# Patient Record
Sex: Female | Born: 1965
Health system: Southern US, Community
[De-identification: ages and names within clinical notes are randomized; demographics above are authoritative.]

## PROBLEM LIST (undated history)

## (undated) ENCOUNTER — Ambulatory Visit: Source: Home / Self Care

## (undated) DIAGNOSIS — E7800 Pure hypercholesterolemia, unspecified: Secondary | ICD-10-CM

## (undated) DIAGNOSIS — B009 Herpesviral infection, unspecified: Secondary | ICD-10-CM

## (undated) DIAGNOSIS — M109 Gout, unspecified: Secondary | ICD-10-CM

## (undated) DIAGNOSIS — I10 Essential (primary) hypertension: Secondary | ICD-10-CM

## (undated) HISTORY — DX: Pure hypercholesterolemia, unspecified: E78.00

## (undated) HISTORY — DX: Herpesviral infection, unspecified: B00.9

## (undated) HISTORY — DX: Gout, unspecified: M10.9

## (undated) HISTORY — DX: Essential (primary) hypertension: I10

## (undated) HISTORY — PX: CERVICAL BIOPSY  W/ LOOP ELECTRODE EXCISION: SUR135

## (undated) HISTORY — PX: INTRAUTERINE DEVICE INSERTION: SHX323

---

## 1998-03-13 ENCOUNTER — Other Ambulatory Visit: Admission: RE | Admit: 1998-03-13 | Discharge: 1998-03-13 | Payer: Self-pay | Admitting: Obstetrics

## 2000-02-12 ENCOUNTER — Other Ambulatory Visit: Admission: RE | Admit: 2000-02-12 | Discharge: 2000-02-12 | Payer: Self-pay | Admitting: Obstetrics

## 2000-05-11 ENCOUNTER — Other Ambulatory Visit: Admission: RE | Admit: 2000-05-11 | Discharge: 2000-05-11 | Payer: Self-pay | Admitting: Obstetrics

## 2000-06-25 ENCOUNTER — Other Ambulatory Visit: Admission: RE | Admit: 2000-06-25 | Discharge: 2000-06-25 | Payer: Self-pay | Admitting: Obstetrics

## 2003-07-16 ENCOUNTER — Ambulatory Visit (HOSPITAL_COMMUNITY): Admission: RE | Admit: 2003-07-16 | Discharge: 2003-07-16 | Payer: Self-pay | Admitting: Obstetrics

## 2003-07-16 ENCOUNTER — Encounter: Payer: Self-pay | Admitting: Obstetrics

## 2005-09-09 ENCOUNTER — Ambulatory Visit (HOSPITAL_COMMUNITY): Admission: RE | Admit: 2005-09-09 | Discharge: 2005-09-09 | Payer: Self-pay | Admitting: Obstetrics

## 2005-09-09 ENCOUNTER — Encounter (INDEPENDENT_AMBULATORY_CARE_PROVIDER_SITE_OTHER): Payer: Self-pay | Admitting: *Deleted

## 2005-10-14 ENCOUNTER — Ambulatory Visit (HOSPITAL_COMMUNITY): Admission: RE | Admit: 2005-10-14 | Discharge: 2005-10-14 | Payer: Self-pay | Admitting: Obstetrics

## 2005-10-14 ENCOUNTER — Encounter (INDEPENDENT_AMBULATORY_CARE_PROVIDER_SITE_OTHER): Payer: Self-pay | Admitting: Specialist

## 2008-01-25 ENCOUNTER — Ambulatory Visit (HOSPITAL_COMMUNITY): Admission: RE | Admit: 2008-01-25 | Discharge: 2008-01-25 | Payer: Self-pay | Admitting: Obstetrics

## 2008-01-25 ENCOUNTER — Encounter (INDEPENDENT_AMBULATORY_CARE_PROVIDER_SITE_OTHER): Payer: Self-pay | Admitting: Obstetrics

## 2008-04-09 ENCOUNTER — Emergency Department (HOSPITAL_COMMUNITY): Admission: EM | Admit: 2008-04-09 | Discharge: 2008-04-09 | Payer: Self-pay | Admitting: Emergency Medicine

## 2008-07-11 ENCOUNTER — Encounter (INDEPENDENT_AMBULATORY_CARE_PROVIDER_SITE_OTHER): Payer: Self-pay | Admitting: Obstetrics

## 2008-07-11 ENCOUNTER — Ambulatory Visit (HOSPITAL_COMMUNITY): Admission: RE | Admit: 2008-07-11 | Discharge: 2008-07-11 | Payer: Self-pay | Admitting: Obstetrics

## 2010-02-27 ENCOUNTER — Ambulatory Visit (HOSPITAL_COMMUNITY): Admission: RE | Admit: 2010-02-27 | Discharge: 2010-02-27 | Payer: Self-pay | Admitting: Obstetrics & Gynecology

## 2010-04-09 ENCOUNTER — Encounter: Admission: RE | Admit: 2010-04-09 | Discharge: 2010-04-09 | Payer: Self-pay | Admitting: Family Medicine

## 2011-03-24 ENCOUNTER — Other Ambulatory Visit (HOSPITAL_COMMUNITY): Payer: Self-pay | Admitting: Obstetrics & Gynecology

## 2011-03-24 DIAGNOSIS — Z1231 Encounter for screening mammogram for malignant neoplasm of breast: Secondary | ICD-10-CM

## 2011-03-25 ENCOUNTER — Ambulatory Visit (HOSPITAL_COMMUNITY)
Admission: RE | Admit: 2011-03-25 | Discharge: 2011-03-25 | Disposition: A | Discharge status: Home / Self Care | Payer: BC Managed Care – PPO | Source: Ambulatory Visit | Attending: Obstetrics & Gynecology | Admitting: Obstetrics & Gynecology

## 2011-03-25 DIAGNOSIS — Z1231 Encounter for screening mammogram for malignant neoplasm of breast: Secondary | ICD-10-CM | POA: Insufficient documentation

## 2011-03-26 ENCOUNTER — Other Ambulatory Visit: Payer: Self-pay | Admitting: Obstetrics & Gynecology

## 2011-03-26 DIAGNOSIS — R928 Other abnormal and inconclusive findings on diagnostic imaging of breast: Secondary | ICD-10-CM

## 2011-03-31 ENCOUNTER — Ambulatory Visit
Admission: RE | Admit: 2011-03-31 | Discharge: 2011-03-31 | Disposition: A | Discharge status: Home / Self Care | Payer: BC Managed Care – PPO | Source: Ambulatory Visit | Attending: Obstetrics & Gynecology | Admitting: Obstetrics & Gynecology

## 2011-03-31 DIAGNOSIS — R928 Other abnormal and inconclusive findings on diagnostic imaging of breast: Secondary | ICD-10-CM

## 2011-04-07 NOTE — Op Note (Signed)
NAME:  Lynn Crawford, Lynn Crawford NO.:  [id number]   MEDICAL RECORD NO.:  [id number]          PATIENT TYPE:  AMB   LOCATION:  SDC                           FACILITY:  [facility]   PHYSICIAN:  Kathreen Cosier, M.D.DATE OF BIRTH:  1966/06/24   DATE OF PROCEDURE:  07/11/2008  DATE OF DISCHARGE:                               OPERATIVE REPORT   PREOP DIAGNOSIS:  Cervical stenosis secondary to cervical colonization.   Under using MAC, the patient in lithotomy position, perineum and vagina  prepped and draped, bladder emptied with straight catheter.  Bimanual  exam revealed uterus to be enlarged and soft.  Speculum placed in the  vagina.  Cervix injected with 10 mL of 1% Xylocaine and the anterior lip  of the cervix grasped with tenaculum.  Endocervical curette was inserted  and the endocervix curetted.  At the same time, large amount of purulent  material was drained from the uterus.  The cervix was then dilated up to  #31 Shawnie Pons and greater than 100 mL of grey purulent material and old  blood drained from the uterine cavity.  There was also some bright red  bleeding at the same time.  Endocervix was curetted.  The endometrium  was not curetted for fear of spreading infection outside the uterine  cavity.  The patient tolerated the procedure well, taken to recovery  room in good condition.           ______________________________  Kathreen Cosier, M.D.     BAM/MEDQ  D:  07/11/2008  T:  07/12/2008  Job:  [id number]

## 2011-04-07 NOTE — Op Note (Signed)
[name], [name] NO.:  [id number]   MEDICAL RECORD NO.:  [id number]          PATIENT TYPE:  AMB   LOCATION:  SDC                           FACILITY:  [facility]   PHYSICIAN:  Kathreen Cosier, M.D.DATE OF BIRTH:  [birth year]   DATE OF PROCEDURE:  01/25/2008  DATE OF DISCHARGE:                               OPERATIVE REPORT   PREOPERATIVE DIAGNOSIS:  Severe dysplasia of the cervix.   PROCEDURE:  Cold knife conization of the cervix.   Using MAC, the patient in lithotomy position, perineum and vagina  prepped and draped, bladder emptied with straight catheter.  Weighted  speculum placed in vagina.  Cervix grasped with the Allis clamp at 3  o'clock and #1 suture placed laterally on the outer aspect at 3 o'clock  and 9 o'clock for hemostasis.  Then a cold knife cone was done in the  usual manner.  Hemostasis was achieved with U sutures of #1 placed  around the cervix and the cervical canal was sounded and the endometrial  cavity sounded 9 cm.  The patient tolerated the procedure well, taken to  the recovery room in good condition.           ______________________________  Kathreen Cosier, M.D.     BAM/MEDQ  D:  01/25/2008  T:  01/25/2008  Job:  [id number]

## 2011-04-10 NOTE — Op Note (Signed)
NAME:  Lynn Crawford, Lynn Crawford NO.:  [id number]   MEDICAL RECORD NO.:  [id number]          PATIENT TYPE:  AMB   LOCATION:  SDC                           FACILITY:  [facility]   PHYSICIAN:  Kathreen Cosier, M.D.DATE OF BIRTH:  08-23-1966   DATE OF PROCEDURE:  10/14/2005  DATE OF DISCHARGE:                                 OPERATIVE REPORT   PREOPERATIVE DIAGNOSIS:  Dysplasia, dysplastic cells, possibly from the  cervix.   PROCEDURE:  Cold-knife conization.   Monitored anesthesia, the patient in lithotomy position, the perineum and  vagina prepped and draped, bladder emptied with a straight catheter.  Bimanual exam revealed uterus to be normal size.  There was no prolapse.  Negative adnexa.  Weighted speculum placed in vagina.  Hemostatic suture of  #1 was placed at 3 o'clock and 9 o'clock on the lateral aspects of the  cervix.  Then a cold knife cone done in the usual manner, and hemostasis was  achieved with few sutures of #1 chromic on the cervix.  The cervical canal  was then sounded and noted to be open.  The patient tolerated the procedure  well and taken to the recovery room in good condition.           ______________________________  Kathreen Cosier, M.D.     BAM/MEDQ  D:  10/14/2005  T:  10/14/2005  Job:  [id number]

## 2011-04-10 NOTE — Op Note (Signed)
NAME:  Lynn Crawford, HAYDON NO.:  [id number]   MEDICAL RECORD NO.:  [id number]          PATIENT TYPE:  AMB   LOCATION:  SDC                           [facility]ILITY:  [facility]   PHYSICIAN:  Kathreen Cosier, M.D.DATE OF BIRTH:  06-22-1966   DATE OF PROCEDURE:  09/09/2005  DATE OF DISCHARGE:                                 OPERATIVE REPORT   PREOPERATIVE DIAGNOSIS:  Dysfunctional uterine bleeding.   PROCEDURE:  Hysteroscopy and sharp curettage.   Under general anesthesia, the patient in lithotomy position, perineum and  vagina prepped and draped, bladder emptied with a straight catheter.  Bimanual exam revealed the uterus to be normal size, negative adnexa.  Speculum placed in the vagina, cervix injected with 10 mL of 1% Xylocaine at  3, 9 and 12 o'clock.  Anterior lip of the cervix grasped with a tenaculum  and the cervix curetted and a small amount of tissue obtained.  The  endometrial cavity was sounded to 10 cm.  The cavity was posterior.  The  cervix was dilated to a #27 Shawnie Pons and the diagnostic hysteroscope inserted.  The cavity was normal, no myomas, no polyps.  Fluid deficit 30 mL.  Sharp  curettage was then performed and a large amount of tissue obtained.  The  patient tolerates the procedure well, is taken to the recovery room in good  condition.           ______________________________  Kathreen Cosier, M.D.     BAM/MEDQ  D:  09/09/2005  T:  09/09/2005  Job:  [id number]

## 2011-08-17 LAB — CBC
HCT: 37.2
MCV: 90.6
Platelets: 349
RDW: 13.3

## 2011-08-19 LAB — PREGNANCY, URINE: Preg Test, Ur: NEGATIVE

## 2011-08-19 LAB — DIFFERENTIAL
Basophils Absolute: 0
Basophils Relative: 0
Eosinophils Absolute: 0.1
Eosinophils Relative: 1
Lymphocytes Relative: 22
Lymphs Abs: 2.9
Monocytes Absolute: 0.7
Monocytes Relative: 5
Neutro Abs: 9.6 — ABNORMAL HIGH
Neutrophils Relative %: 72

## 2011-08-19 LAB — URINALYSIS, ROUTINE W REFLEX MICROSCOPIC
Bilirubin Urine: NEGATIVE
Glucose, UA: NEGATIVE
Ketones, ur: NEGATIVE
Nitrite: NEGATIVE
Protein, ur: NEGATIVE
Specific Gravity, Urine: 1.024
Urobilinogen, UA: 0.2
pH: 5.5

## 2011-08-19 LAB — BASIC METABOLIC PANEL
CO2: 23
Chloride: 108
GFR calc Af Amer: >60
Potassium: 3.7
Sodium: 138

## 2011-08-19 LAB — CBC
HCT: 39
Hemoglobin: 13.3
MCHC: 34
MCV: 89.4
Platelets: 313
RBC: 4.37
RDW: 13
WBC: 13.4 — ABNORMAL HIGH

## 2011-08-19 LAB — BASIC METABOLIC PANEL WITH GFR
BUN: 14
Calcium: 9.3
Creatinine, Ser: 0.85
GFR calc non Af Amer: >60
Glucose, Bld: 101 — ABNORMAL HIGH

## 2011-08-19 LAB — WET PREP, GENITAL
Trich, Wet Prep: NONE SEEN
Yeast Wet Prep HPF POC: NONE SEEN

## 2011-08-19 LAB — GC/CHLAMYDIA PROBE AMP, GENITAL
Chlamydia, DNA Probe: NEGATIVE
GC Probe Amp, Genital: NEGATIVE

## 2011-08-19 LAB — URINE MICROSCOPIC-ADD ON

## 2012-03-14 ENCOUNTER — Other Ambulatory Visit (HOSPITAL_COMMUNITY): Payer: Self-pay | Admitting: Obstetrics & Gynecology

## 2012-03-14 DIAGNOSIS — Z1231 Encounter for screening mammogram for malignant neoplasm of breast: Secondary | ICD-10-CM

## 2012-04-06 ENCOUNTER — Ambulatory Visit (HOSPITAL_COMMUNITY)
Admission: RE | Admit: 2012-04-06 | Discharge: 2012-04-06 | Disposition: A | Discharge status: Home / Self Care | Payer: BC Managed Care – PPO | Source: Ambulatory Visit | Attending: Obstetrics & Gynecology | Admitting: Obstetrics & Gynecology

## 2012-04-06 DIAGNOSIS — Z1231 Encounter for screening mammogram for malignant neoplasm of breast: Secondary | ICD-10-CM | POA: Insufficient documentation

## 2013-05-02 ENCOUNTER — Other Ambulatory Visit (HOSPITAL_COMMUNITY): Payer: Self-pay | Admitting: Obstetrics & Gynecology

## 2013-05-02 DIAGNOSIS — Z1231 Encounter for screening mammogram for malignant neoplasm of breast: Secondary | ICD-10-CM

## 2013-05-04 ENCOUNTER — Ambulatory Visit (HOSPITAL_COMMUNITY)
Admission: RE | Admit: 2013-05-04 | Discharge: 2013-05-04 | Disposition: A | Discharge status: Home / Self Care | Payer: BC Managed Care – PPO | Source: Ambulatory Visit | Attending: Obstetrics & Gynecology | Admitting: Obstetrics & Gynecology

## 2013-05-04 DIAGNOSIS — Z1231 Encounter for screening mammogram for malignant neoplasm of breast: Secondary | ICD-10-CM

## 2013-05-08 ENCOUNTER — Other Ambulatory Visit: Payer: Self-pay | Admitting: Obstetrics & Gynecology

## 2013-05-08 DIAGNOSIS — R928 Other abnormal and inconclusive findings on diagnostic imaging of breast: Secondary | ICD-10-CM

## 2013-05-24 ENCOUNTER — Other Ambulatory Visit: Payer: BC Managed Care – PPO

## 2013-06-13 ENCOUNTER — Ambulatory Visit
Admission: RE | Admit: 2013-06-13 | Discharge: 2013-06-13 | Disposition: A | Discharge status: Home / Self Care | Payer: BC Managed Care – PPO | Source: Ambulatory Visit | Attending: Obstetrics & Gynecology | Admitting: Obstetrics & Gynecology

## 2013-06-13 DIAGNOSIS — R928 Other abnormal and inconclusive findings on diagnostic imaging of breast: Secondary | ICD-10-CM

## 2014-07-25 ENCOUNTER — Other Ambulatory Visit (HOSPITAL_COMMUNITY): Payer: Self-pay | Admitting: Obstetrics & Gynecology

## 2014-07-25 DIAGNOSIS — Z1231 Encounter for screening mammogram for malignant neoplasm of breast: Secondary | ICD-10-CM

## 2014-07-27 ENCOUNTER — Ambulatory Visit (HOSPITAL_COMMUNITY): Payer: BC Managed Care – PPO | Attending: Obstetrics & Gynecology

## 2014-08-13 ENCOUNTER — Ambulatory Visit (HOSPITAL_COMMUNITY)
Admission: RE | Admit: 2014-08-13 | Discharge: 2014-08-13 | Disposition: A | Discharge status: Home / Self Care | Payer: BC Managed Care – PPO | Source: Ambulatory Visit | Attending: Obstetrics & Gynecology | Admitting: Obstetrics & Gynecology

## 2014-08-13 DIAGNOSIS — Z1231 Encounter for screening mammogram for malignant neoplasm of breast: Secondary | ICD-10-CM

## 2016-03-27 DIAGNOSIS — K219 Gastro-esophageal reflux disease without esophagitis: Secondary | ICD-10-CM | POA: Diagnosis not present

## 2016-03-27 DIAGNOSIS — E782 Mixed hyperlipidemia: Secondary | ICD-10-CM | POA: Diagnosis not present

## 2016-03-27 DIAGNOSIS — I10 Essential (primary) hypertension: Secondary | ICD-10-CM | POA: Diagnosis not present

## 2016-03-27 DIAGNOSIS — G245 Blepharospasm: Secondary | ICD-10-CM | POA: Diagnosis not present

## 2016-09-17 DIAGNOSIS — H16291 Other keratoconjunctivitis, right eye: Secondary | ICD-10-CM | POA: Diagnosis not present

## 2016-09-21 DIAGNOSIS — H16291 Other keratoconjunctivitis, right eye: Secondary | ICD-10-CM | POA: Diagnosis not present

## 2016-11-03 DIAGNOSIS — Z1231 Encounter for screening mammogram for malignant neoplasm of breast: Secondary | ICD-10-CM | POA: Diagnosis not present

## 2016-11-03 DIAGNOSIS — Z6833 Body mass index (BMI) 33.0-33.9, adult: Secondary | ICD-10-CM | POA: Diagnosis not present

## 2016-11-03 DIAGNOSIS — Z01419 Encounter for gynecological examination (general) (routine) without abnormal findings: Secondary | ICD-10-CM | POA: Diagnosis not present

## 2016-12-15 DIAGNOSIS — H5213 Myopia, bilateral: Secondary | ICD-10-CM | POA: Diagnosis not present

## 2016-12-15 DIAGNOSIS — H524 Presbyopia: Secondary | ICD-10-CM | POA: Diagnosis not present

## 2017-06-23 DIAGNOSIS — E669 Obesity, unspecified: Secondary | ICD-10-CM | POA: Diagnosis not present

## 2017-06-23 DIAGNOSIS — A6004 Herpesviral vulvovaginitis: Secondary | ICD-10-CM | POA: Diagnosis not present

## 2017-06-23 DIAGNOSIS — M545 Low back pain: Secondary | ICD-10-CM | POA: Diagnosis not present

## 2017-06-24 ENCOUNTER — Other Ambulatory Visit: Payer: Self-pay | Admitting: Family Medicine

## 2017-06-24 ENCOUNTER — Ambulatory Visit
Admission: RE | Admit: 2017-06-24 | Discharge: 2017-06-24 | Disposition: A | Discharge status: Home / Self Care | Payer: BLUE CROSS/BLUE SHIELD | Source: Ambulatory Visit | Attending: Family Medicine | Admitting: Family Medicine

## 2017-06-24 DIAGNOSIS — M545 Low back pain: Secondary | ICD-10-CM | POA: Diagnosis not present

## 2017-06-24 DIAGNOSIS — S335XXA Sprain of ligaments of lumbar spine, initial encounter: Secondary | ICD-10-CM

## 2017-06-24 DIAGNOSIS — M5126 Other intervertebral disc displacement, lumbar region: Secondary | ICD-10-CM | POA: Diagnosis not present

## 2017-12-24 DIAGNOSIS — H524 Presbyopia: Secondary | ICD-10-CM | POA: Diagnosis not present

## 2017-12-24 DIAGNOSIS — H5213 Myopia, bilateral: Secondary | ICD-10-CM | POA: Diagnosis not present

## 2017-12-29 DIAGNOSIS — Z23 Encounter for immunization: Secondary | ICD-10-CM | POA: Diagnosis not present

## 2018-01-18 ENCOUNTER — Encounter: Payer: Self-pay | Admitting: Obstetrics & Gynecology

## 2018-04-05 ENCOUNTER — Ambulatory Visit (INDEPENDENT_AMBULATORY_CARE_PROVIDER_SITE_OTHER): Payer: BLUE CROSS/BLUE SHIELD | Admitting: Obstetrics & Gynecology

## 2018-04-05 ENCOUNTER — Encounter: Payer: Self-pay | Admitting: Obstetrics & Gynecology

## 2018-04-05 VITALS — BP 124/80 | Ht 63.75 in | Wt 190.0 lb

## 2018-04-05 DIAGNOSIS — Z1151 Encounter for screening for human papillomavirus (HPV): Secondary | ICD-10-CM | POA: Diagnosis not present

## 2018-04-05 DIAGNOSIS — Z01419 Encounter for gynecological examination (general) (routine) without abnormal findings: Secondary | ICD-10-CM

## 2018-04-05 DIAGNOSIS — Z8619 Personal history of other infectious and parasitic diseases: Secondary | ICD-10-CM

## 2018-04-05 DIAGNOSIS — Z113 Encounter for screening for infections with a predominantly sexual mode of transmission: Secondary | ICD-10-CM | POA: Diagnosis not present

## 2018-04-05 DIAGNOSIS — Z30431 Encounter for routine checking of intrauterine contraceptive device: Secondary | ICD-10-CM | POA: Diagnosis not present

## 2018-04-05 MED ORDER — VALACYCLOVIR HCL 1 G PO TABS: 500.0000 mg | ORAL_TABLET | Freq: Every day | ORAL | 4 refills | Status: DC

## 2018-04-05 NOTE — Patient Instructions (Signed)
1. Encounter for routine gynecological examination with Papanicolaou smear of cervix Normal gynecologic exam.  Pap with high-risk HPV, gonorrhea and chlamydia done.  Breast exam normal.  Will schedule screening mammogram now.  Health labs at work.  Counseling on screening colonoscopy done, recommended to organize now, patient will call when ready.  2. Encounter for routine checking of intrauterine contraceptive device (IUD) Mirena IUD well-tolerated and in good location.  Will be due to replace the IUD in March 2020.  3. H/O herpes genitalis No recent recurrence on Valtrex generic prophylaxis.  Prescription sent to pharmacy.  Other orders - valACYclovir (VALTREX) 1000 MG tablet; Take 0.5 tablets (500 mg total) by mouth daily.  Lynn Crawford, it was a pleasure seeing you today!  I will inform you of your results as soon as they are available.

## 2018-04-05 NOTE — Progress Notes (Signed)
Lynn Crawford 21-Jun-1966 [id number]   History:    52 y.o. G3P1A2L1  Divorced.  No current boyfriend.  Has a 98 yo grand-child.  RP:  Established patient presenting for annual gyn exam   HPI: Well on Mirena IUD x 01/2014.  Light menses.  No pelvic pain.  Normal vaginal secretions.  Not sexually active currently. H/O LEEP x 2.  Genital HSV, no recurrence on Valtrex prophylaxis. Urine/BMs wnl.  Breasts wnl.  BMI 32.87.  Health labs at work.  Past medical history,surgical history, family history and social history were all reviewed and documented in the EPIC chart.  Gynecologic History No LMP recorded. (Menstrual status: IUD). Contraception: IUD Last Pap: 09/2015. Results were: Negative/HR HPV neg Last mammogram: 10/2016. Results were: Negative Bone Density: Never Colonoscopy: Never.  Recommended to organize, patient will call back when ready.  Obstetric History OB History  Gravida Para Term Preterm AB Living  SAB TAB Ectopic Multiple Live Births               # Outcome Date GA Lbr Len/2nd Weight Sex Delivery Anes PTL Lv  3 AB           2 AB           1 Para              ROS: A ROS was performed and pertinent positives and negatives are included in the history.  GENERAL: No fevers or chills. HEENT: No change in vision, no earache, sore throat or sinus congestion. NECK: No pain or stiffness. CARDIOVASCULAR: No chest pain or pressure. No palpitations. PULMONARY: No shortness of breath, cough or wheeze. GASTROINTESTINAL: No abdominal pain, nausea, vomiting or diarrhea, melena or bright red blood per rectum. GENITOURINARY: No urinary frequency, urgency, hesitancy or dysuria. MUSCULOSKELETAL: No joint or muscle pain, no back pain, no recent trauma. DERMATOLOGIC: No rash, no itching, no lesions. ENDOCRINE: No polyuria, polydipsia, no heat or cold intolerance. No recent change in weight. HEMATOLOGICAL: No anemia or easy bruising or bleeding. NEUROLOGIC: No headache, seizures,  numbness, tingling or weakness. PSYCHIATRIC: No depression, no loss of interest in normal activity or change in sleep pattern.     Exam:   BP 124/80   Ht 5' 3.75" (1.619 m)   Wt 190 lb (86.2 kg)   BMI 32.87 kg/m   Body mass index is 32.87 kg/m.  General appearance : Well developed well nourished female. No acute distress HEENT: Eyes: no retinal hemorrhage or exudates,  Neck supple, trachea midline, no carotid bruits, no thyroidmegaly Lungs: Clear to auscultation, no rhonchi or wheezes, or rib retractions  Heart: Regular rate and rhythm, no murmurs or gallops Breast:Examined in sitting and supine position were symmetrical in appearance, no palpable masses or tenderness,  no skin retraction, no nipple inversion, no nipple discharge, no skin discoloration, no axillary or supraclavicular lymphadenopathy Abdomen: no palpable masses or tenderness, no rebound or guarding Extremities: no edema or skin discoloration or tenderness  Pelvic: Vulva: Normal             Vagina: No gross lesions or discharge  Cervix: No gross lesions or discharge.  IUD strings seen.  Pap/HR HPV/Gono-chlam done.  Uterus  AV, normal size, shape and consistency, non-tender and mobile  Adnexa  Without masses or tenderness  Anus: Normal   Assessment/Plan:  52 y.o. female for annual exam   1. Encounter for routine gynecological examination with Papanicolaou smear  of cervix Normal gynecologic exam.  Pap with high-risk HPV, gonorrhea and chlamydia done.  Breast exam normal.  Will schedule screening mammogram now.  Health labs at work.  Counseling on screening colonoscopy done, recommended to organize now, patient will call when ready.  2. Encounter for routine checking of intrauterine contraceptive device (IUD) Mirena IUD well-tolerated and in good location.  Will be due to replace the IUD in March 2020.  3. H/O herpes genitalis No recent recurrence on Valtrex generic prophylaxis.  Prescription sent to  pharmacy.  Other orders - valACYclovir (VALTREX) 1000 MG tablet; Take 0.5 tablets (500 mg total) by mouth daily.  Genia Del MD, 11:54 AM 04/05/2018

## 2018-04-06 LAB — PAP IG, CT-NG NAA, HPV HIGH-RISK
C. TRACHOMATIS RNA, TMA: NOT DETECTED
HPV DNA HIGH RISK: NOT DETECTED
N. gonorrhoeae RNA, TMA: NOT DETECTED

## 2018-07-08 ENCOUNTER — Ambulatory Visit: Payer: BLUE CROSS/BLUE SHIELD | Admitting: Obstetrics & Gynecology

## 2018-07-14 ENCOUNTER — Ambulatory Visit: Payer: BLUE CROSS/BLUE SHIELD | Admitting: Obstetrics & Gynecology

## 2018-08-18 ENCOUNTER — Other Ambulatory Visit: Payer: Self-pay | Admitting: Obstetrics & Gynecology

## 2018-08-18 DIAGNOSIS — Z1231 Encounter for screening mammogram for malignant neoplasm of breast: Secondary | ICD-10-CM

## 2018-08-22 ENCOUNTER — Ambulatory Visit
Admission: RE | Admit: 2018-08-22 | Discharge: 2018-08-22 | Disposition: A | Discharge status: Home / Self Care | Payer: BLUE CROSS/BLUE SHIELD | Source: Ambulatory Visit | Attending: Obstetrics & Gynecology | Admitting: Obstetrics & Gynecology

## 2018-08-22 DIAGNOSIS — Z1231 Encounter for screening mammogram for malignant neoplasm of breast: Secondary | ICD-10-CM | POA: Diagnosis not present

## 2019-01-12 DIAGNOSIS — I10 Essential (primary) hypertension: Secondary | ICD-10-CM | POA: Diagnosis not present

## 2019-01-12 DIAGNOSIS — R946 Abnormal results of thyroid function studies: Secondary | ICD-10-CM | POA: Diagnosis not present

## 2019-01-12 DIAGNOSIS — R002 Palpitations: Secondary | ICD-10-CM | POA: Diagnosis not present

## 2019-01-26 DIAGNOSIS — I10 Essential (primary) hypertension: Secondary | ICD-10-CM | POA: Diagnosis not present

## 2019-01-26 DIAGNOSIS — R000 Tachycardia, unspecified: Secondary | ICD-10-CM | POA: Diagnosis not present

## 2019-01-26 DIAGNOSIS — E0590 Thyrotoxicosis, unspecified without thyrotoxic crisis or storm: Secondary | ICD-10-CM | POA: Diagnosis not present

## 2019-02-07 ENCOUNTER — Other Ambulatory Visit: Payer: Self-pay | Admitting: Endocrinology

## 2019-02-07 ENCOUNTER — Other Ambulatory Visit (HOSPITAL_COMMUNITY): Payer: Self-pay | Admitting: Endocrinology

## 2019-02-07 DIAGNOSIS — E0590 Thyrotoxicosis, unspecified without thyrotoxic crisis or storm: Secondary | ICD-10-CM

## 2019-02-15 ENCOUNTER — Ambulatory Visit (HOSPITAL_COMMUNITY)
Admission: RE | Admit: 2019-02-15 | Discharge: 2019-02-15 | Disposition: A | Discharge status: Home / Self Care | Payer: BLUE CROSS/BLUE SHIELD | Source: Ambulatory Visit | Attending: Endocrinology | Admitting: Endocrinology

## 2019-02-15 ENCOUNTER — Other Ambulatory Visit: Payer: Self-pay

## 2019-02-15 DIAGNOSIS — E0590 Thyrotoxicosis, unspecified without thyrotoxic crisis or storm: Secondary | ICD-10-CM | POA: Insufficient documentation

## 2019-02-15 MED ORDER — SODIUM IODIDE I-123 7.4 MBQ CAPS: 400.0000 | ORAL_CAPSULE | Freq: Once | ORAL | Status: DC

## 2019-02-16 ENCOUNTER — Other Ambulatory Visit (HOSPITAL_COMMUNITY): Payer: BLUE CROSS/BLUE SHIELD

## 2019-02-16 ENCOUNTER — Encounter (HOSPITAL_COMMUNITY)
Admission: RE | Admit: 2019-02-16 | Discharge: 2019-02-16 | Disposition: A | Discharge status: Home / Self Care | Payer: BLUE CROSS/BLUE SHIELD | Source: Ambulatory Visit | Attending: Endocrinology | Admitting: Endocrinology

## 2019-02-16 DIAGNOSIS — E049 Nontoxic goiter, unspecified: Secondary | ICD-10-CM | POA: Diagnosis not present

## 2019-04-10 ENCOUNTER — Encounter: Payer: BLUE CROSS/BLUE SHIELD | Admitting: Obstetrics & Gynecology

## 2019-04-11 ENCOUNTER — Encounter: Payer: BLUE CROSS/BLUE SHIELD | Admitting: Obstetrics & Gynecology

## 2019-04-18 ENCOUNTER — Other Ambulatory Visit: Payer: Self-pay

## 2019-04-18 ENCOUNTER — Ambulatory Visit: Payer: BLUE CROSS/BLUE SHIELD | Admitting: Interventional Cardiology

## 2019-04-19 ENCOUNTER — Ambulatory Visit (INDEPENDENT_AMBULATORY_CARE_PROVIDER_SITE_OTHER): Payer: BLUE CROSS/BLUE SHIELD | Admitting: Obstetrics & Gynecology

## 2019-04-19 ENCOUNTER — Encounter: Payer: Self-pay | Admitting: Obstetrics & Gynecology

## 2019-04-19 VITALS — BP 140/88 | Ht 63.5 in | Wt 192.0 lb

## 2019-04-19 DIAGNOSIS — Z30431 Encounter for routine checking of intrauterine contraceptive device: Secondary | ICD-10-CM

## 2019-04-19 DIAGNOSIS — E6609 Other obesity due to excess calories: Secondary | ICD-10-CM | POA: Diagnosis not present

## 2019-04-19 DIAGNOSIS — Z6833 Body mass index (BMI) 33.0-33.9, adult: Secondary | ICD-10-CM | POA: Diagnosis not present

## 2019-04-19 DIAGNOSIS — Z01419 Encounter for gynecological examination (general) (routine) without abnormal findings: Secondary | ICD-10-CM

## 2019-04-19 NOTE — Progress Notes (Signed)
Lynn Crawford Mar 26, 1966 [id number]   History:    53 y.o. G3P1A2L1   RP:  Established patient presenting for annual gyn exam   HPI: Well on Mirena IUD x 01/2014.  Currently abstinent.  No pelvic pain.  Normal vaginal secretions.  Urine/BMs normal.  Breasts normal.  BMI 33.48.  Not exercising regularly.  Health labs at work.  No Colono so far.  Past medical history,surgical history, family history and social history were all reviewed and documented in the EPIC chart.  Gynecologic History No LMP recorded. (Menstrual status: IUD). Contraception: condoms and Mirena IUD x 01/2014 Last Pap: 03/2018.  Results were: Negative Last mammogram: 07/2018. Results were: Negative Bone Density: Never Colonoscopy: Not done yet.  Will refer to [facility].  Obstetric History OB History  Gravida Para Term Preterm AB Living  3 1     2 1   SAB TAB Ectopic Multiple Live Births               # Outcome Date GA Lbr Len/2nd Weight Sex Delivery Anes PTL Lv  3 AB           2 AB           1 Para              ROS: A ROS was performed and pertinent positives and negatives are included in the history.  GENERAL: No fevers or chills. HEENT: No change in vision, no earache, sore throat or sinus congestion. NECK: No pain or stiffness. CARDIOVASCULAR: No chest pain or pressure. No palpitations. PULMONARY: No shortness of breath, cough or wheeze. GASTROINTESTINAL: No abdominal pain, nausea, vomiting or diarrhea, melena or bright red blood per rectum. GENITOURINARY: No urinary frequency, urgency, hesitancy or dysuria. MUSCULOSKELETAL: No joint or muscle pain, no back pain, no recent trauma. DERMATOLOGIC: No rash, no itching, no lesions. ENDOCRINE: No polyuria, polydipsia, no heat or cold intolerance. No recent change in weight. HEMATOLOGICAL: No anemia or easy bruising or bleeding. NEUROLOGIC: No headache, seizures, numbness, tingling or weakness. PSYCHIATRIC: No depression, no loss of interest in normal activity or change  in sleep pattern.     Exam:   BP 140/88   Ht 5' 3.5" (1.613 m)   Wt 192 lb (87.1 kg)   BMI 33.48 kg/m   Body mass index is 33.48 kg/m.  General appearance : Well developed well nourished female. No acute distress HEENT: Eyes: no retinal hemorrhage or exudates,  Neck supple, trachea midline, no carotid bruits, no thyroidmegaly Lungs: Clear to auscultation, no rhonchi or wheezes, or rib retractions  Heart: Regular rate and rhythm, no murmurs or gallops Breast:Examined in sitting and supine position were symmetrical in appearance, no palpable masses or tenderness,  no skin retraction, no nipple inversion, no nipple discharge, no skin discoloration, no axillary or supraclavicular lymphadenopathy Abdomen: no palpable masses or tenderness, no rebound or guarding Extremities: no edema or skin discoloration or tenderness  Pelvic: Vulva: Normal             Vagina: No gross lesions or discharge  Cervix: No gross lesions or discharge  Uterus  AV, normal size, shape and consistency, non-tender and mobile  Adnexa  Without masses or tenderness  Anus: Normal   Assessment/Plan:  54 y.o. female for annual exam   1. Well female exam with routine gynecological exam Normal gynecologic exam.  Pap test in May 2019 was negative, no indication to repeat this year.  Breast exam normal.  Screening mammogram September 2019  was negative.  Will refer to gastro to schedule a colonoscopy now.  Health labs with family physician.  2. Encounter for routine checking of intrauterine contraceptive device (IUD) Mirena IUD well-tolerated and in good location.  Slightly overdue for change in Mirena IUD which was inserted in March 2015.  Use condoms until the Mirena IUD is changed.  F/U removal of Mirena IUD, insertion of a new Mirena IUD.  3. Class 1 obesity due to excess calories without serious comorbidity with body mass index (BMI) of 33.0 to 33.9 in adult Recommend a lower calorie/carb diet such as [employer]  diet.  Increase physical activities with aerobic activities 5 times a week and weightlifting every 2 days.  Other orders - metoprolol tartrate (LOPRESSOR) 25 MG tablet; Take 25 mg by mouth 2 (two) times daily. - pantoprazole (PROTONIX) 40 MG tablet; Take 40 mg by mouth daily.  Genia DelMarie-Lyne Stesha Neyens MD, 3:10 PM 04/19/2019

## 2019-04-21 ENCOUNTER — Encounter: Payer: Self-pay | Admitting: Obstetrics & Gynecology

## 2019-04-21 NOTE — Patient Instructions (Signed)
1. Well female exam with routine gynecological exam Normal gynecologic exam.  Pap test in May 2019 was negative, no indication to repeat this year.  Breast exam normal.  Screening mammogram September 2019 was negative.  Will refer to gastro to schedule a colonoscopy now.  Health labs with family physician.  2. Encounter for routine checking of intrauterine contraceptive device (IUD) Mirena IUD well-tolerated and in good location.  Slightly overdue for change in Mirena IUD which was inserted in March 2015.  Use condoms until the Mirena IUD is changed.  F/U removal of Mirena IUD, insertion of a new Mirena IUD.  3. Class 1 obesity due to excess calories without serious comorbidity with body mass index (BMI) of 33.0 to 33.9 in adult Recommend a lower calorie/carb diet such as [employer].  Increase physical activities with aerobic activities 5 times a week and weightlifting every 2 days.  Other orders - metoprolol tartrate (LOPRESSOR) 25 MG tablet; Take 25 mg by mouth 2 (two) times daily. - pantoprazole (PROTONIX) 40 MG tablet; Take 40 mg by mouth daily.  Lynn Crawford, it was a pleasure seeing you today!

## 2019-04-24 ENCOUNTER — Other Ambulatory Visit: Payer: Self-pay

## 2019-04-25 ENCOUNTER — Ambulatory Visit (INDEPENDENT_AMBULATORY_CARE_PROVIDER_SITE_OTHER): Payer: BC Managed Care – PPO | Admitting: Obstetrics & Gynecology

## 2019-04-25 ENCOUNTER — Encounter: Payer: Self-pay | Admitting: Obstetrics & Gynecology

## 2019-04-25 VITALS — BP 140/86

## 2019-04-25 DIAGNOSIS — Z30433 Encounter for removal and reinsertion of intrauterine contraceptive device: Secondary | ICD-10-CM

## 2019-04-25 NOTE — Progress Notes (Signed)
    Lynn Crawford [birth year] [id number]        53 y.o.  [id number] divorced.  RP: Mirena IUD removal/insertion  HPI: Mirena IUD x 01/2014.  Well tolerated.  No breakthrough bleeding.  No pelvic pain.  No abnormal vaginal discharge.  No fever.   OB History  Gravida Para Term Preterm AB Living  3 1     2 1   SAB TAB Ectopic Multiple Live Births               # Outcome Date GA Lbr Len/2nd Weight Sex Delivery Anes PTL Lv  3 AB           2 AB           1 Para             Past medical history,surgical history, problem list, medications, allergies, family history and social history were all reviewed and documented in the EPIC chart.   Directed ROS with pertinent positives and negatives documented in the history of present illness/assessment and plan.  Exam:  Vitals:   04/25/19 1436  BP: 140/86   General appearance:  Normal                                                                    IUD procedure note       Patient presented to the office today for removal and placement of Mirena IUD. The patient had previously been provided with literature information on this method of contraception. The risks benefits and pros and cons were discussed and all her questions were answered. She is fully aware that this form of contraception is 99% effective and is good for 5 years.  Pelvic exam: Vulva normal Vagina: No lesions or discharge Cervix: No lesions or discharge.  IUD strings visible.  IUD strings grasped with a fenestrated clamp and IUD pulled out easily.  Intact, complete, shown to patient and discarded.   Uterus: AV position Adnexa: No masses or tenderness Rectal exam: Not done  The cervix was cleansed with Betadine solution. Hurricane spray on the cervix.  A single-tooth tenaculum was placed on the anterior cervical lip.  Dilation of cervix with the Os Finder.  Hysterometry at 8 cm.  The IUD was shown to the patient and inserted in a sterile fashion.  The IUD string was trimmed.  The single-tooth tenaculum was removed. Patient was instructed to return back to the office in one month for follow up.        Assessment/Plan:  53 y.o. [id number]   1. Encounter for IUD removal and reinsertion Easily removal of Mirena IUD.  Insertion well-tolerated after Os finder dilation of cervix.  No complication.  Precautions reviewed with patient.  Follow-up in 4 weeks for IUD check.   Genia Del MD, 2:56 PM 04/25/2019

## 2019-04-29 ENCOUNTER — Encounter: Payer: Self-pay | Admitting: Obstetrics & Gynecology

## 2019-04-29 NOTE — Patient Instructions (Signed)
1. Encounter for IUD removal and reinsertion Easily removal of Mirena IUD.  Insertion well-tolerated after Os finder dilation of cervix.  No complication.  Precautions reviewed with patient.  Follow-up in 4 weeks for IUD check.  Lynn Crawford, it was a pleasure seeing you today!

## 2019-05-22 ENCOUNTER — Other Ambulatory Visit: Payer: Self-pay

## 2019-05-23 ENCOUNTER — Encounter: Payer: Self-pay | Admitting: Obstetrics & Gynecology

## 2019-05-23 ENCOUNTER — Ambulatory Visit: Payer: BC Managed Care – PPO | Admitting: Obstetrics & Gynecology

## 2019-05-23 ENCOUNTER — Encounter: Payer: Self-pay | Admitting: *Deleted

## 2019-05-23 VITALS — BP 128/86

## 2019-05-23 DIAGNOSIS — N898 Other specified noninflammatory disorders of vagina: Secondary | ICD-10-CM

## 2019-05-23 DIAGNOSIS — Z30431 Encounter for routine checking of intrauterine contraceptive device: Secondary | ICD-10-CM | POA: Diagnosis not present

## 2019-05-23 LAB — WET PREP FOR TRICH, YEAST, CLUE

## 2019-05-23 MED ORDER — TINIDAZOLE 500 MG PO TABS: 2.0000 g | ORAL_TABLET | Freq: Every day | ORAL | 0 refills | Status: AC

## 2019-05-23 NOTE — Patient Instructions (Signed)
1. Encounter for routine checking of intrauterine contraceptive device (IUD) Mirena IUD in good location, well tolerated, no sign of infection except for Bacterial Vaginosis.  Reassured.  2. Vaginal discharge Bacterial Vaginosis confirmed by wet prep.  Decision to treat with Tinidazole.  Usage reviewed and prescription sent to pharmacy. - WET PREP FOR Lynn Crawford, YEAST, CLUE  Other orders - tinidazole (TINDAMAX) 500 MG tablet; Take 4 tablets (2,000 mg total) by mouth daily for 2 days.  Ikeisha, it was a pleasure seeing you today!

## 2019-05-23 NOTE — Progress Notes (Signed)
    Lynn Crawford 26-Feb-1966 [id number]        53 y.o.  [id number] Divorced  RP: Mirena IUD check 4 weeks post insertion  HPI: Well with Mirena IUD.  Had BTB x 1 week after insertion, resolved now.  No pelvic pain.  Mild increase in vaginal discharge with odor.  No vaginal itching.  No pelvic pain.  No fever.   OB History  Gravida Para Term Preterm AB Living  3 1     2 1   SAB TAB Ectopic Multiple Live Births               # Outcome Date GA Lbr Len/2nd Weight Sex Delivery Anes PTL Lv  3 AB           2 AB           1 Para             Past medical history,surgical history, problem list, medications, allergies, family history and social history were all reviewed and documented in the EPIC chart.   Directed ROS with pertinent positives and negatives documented in the history of present illness/assessment and plan.  Exam:  Vitals:   05/23/19 1208  BP: 128/86   General appearance:  Normal  Abdomen: Normal  Gynecologic exam: Vulva normal.  Speculum:  Cervix normal.  IUD strings visible at EO.  No bleeding.  Mild increase in vaginal discharge.  Wet prep done.  Wet prep:  Clue cells present   Assessment/Plan:  53 y.o. [id number]   1. Encounter for routine checking of intrauterine contraceptive device (IUD) Mirena IUD in good location, well tolerated, no sign of infection except for Bacterial Vaginosis.  Reassured.  2. Vaginal discharge Bacterial Vaginosis confirmed by wet prep.  Decision to treat with Tinidazole.  Usage reviewed and prescription sent to pharmacy. - WET PREP FOR [place], YEAST, CLUE  Other orders - tinidazole (TINDAMAX) 500 MG tablet; Take 4 tablets (2,000 mg total) by mouth daily for 2 days.  Counseling on above issues and coordination of care >50% x 15 minutes.  Princess Bruins MD, 12:15 PM 05/23/2019

## 2019-05-25 ENCOUNTER — Encounter: Payer: Self-pay | Admitting: Anesthesiology

## 2019-06-25 ENCOUNTER — Other Ambulatory Visit: Payer: Self-pay | Admitting: Obstetrics & Gynecology

## 2019-08-25 DIAGNOSIS — I10 Essential (primary) hypertension: Secondary | ICD-10-CM | POA: Diagnosis not present

## 2019-08-25 DIAGNOSIS — E782 Mixed hyperlipidemia: Secondary | ICD-10-CM | POA: Diagnosis not present

## 2019-08-25 DIAGNOSIS — E0590 Thyrotoxicosis, unspecified without thyrotoxic crisis or storm: Secondary | ICD-10-CM | POA: Diagnosis not present

## 2019-08-30 DIAGNOSIS — E0590 Thyrotoxicosis, unspecified without thyrotoxic crisis or storm: Secondary | ICD-10-CM | POA: Diagnosis not present

## 2019-08-30 DIAGNOSIS — E782 Mixed hyperlipidemia: Secondary | ICD-10-CM | POA: Diagnosis not present

## 2019-12-04 ENCOUNTER — Other Ambulatory Visit: Payer: Self-pay | Admitting: Obstetrics & Gynecology

## 2019-12-04 DIAGNOSIS — Z1231 Encounter for screening mammogram for malignant neoplasm of breast: Secondary | ICD-10-CM

## 2019-12-07 ENCOUNTER — Other Ambulatory Visit: Payer: Self-pay

## 2019-12-07 ENCOUNTER — Ambulatory Visit
Admission: RE | Admit: 2019-12-07 | Discharge: 2019-12-07 | Disposition: A | Discharge status: Home / Self Care | Payer: BC Managed Care – PPO | Source: Ambulatory Visit | Attending: Obstetrics & Gynecology | Admitting: Obstetrics & Gynecology

## 2019-12-07 DIAGNOSIS — Z1231 Encounter for screening mammogram for malignant neoplasm of breast: Secondary | ICD-10-CM

## 2019-12-07 DIAGNOSIS — H524 Presbyopia: Secondary | ICD-10-CM | POA: Diagnosis not present

## 2019-12-07 DIAGNOSIS — H5213 Myopia, bilateral: Secondary | ICD-10-CM | POA: Diagnosis not present

## 2019-12-12 ENCOUNTER — Other Ambulatory Visit: Payer: Self-pay | Admitting: Obstetrics & Gynecology

## 2019-12-12 DIAGNOSIS — R928 Other abnormal and inconclusive findings on diagnostic imaging of breast: Secondary | ICD-10-CM

## 2020-01-01 ENCOUNTER — Ambulatory Visit
Admission: RE | Admit: 2020-01-01 | Discharge: 2020-01-01 | Disposition: A | Discharge status: Home / Self Care | Payer: BC Managed Care – PPO | Source: Ambulatory Visit | Attending: Obstetrics & Gynecology | Admitting: Obstetrics & Gynecology

## 2020-01-01 ENCOUNTER — Other Ambulatory Visit: Payer: Self-pay

## 2020-01-01 DIAGNOSIS — R928 Other abnormal and inconclusive findings on diagnostic imaging of breast: Secondary | ICD-10-CM

## 2020-01-01 DIAGNOSIS — N6002 Solitary cyst of left breast: Secondary | ICD-10-CM | POA: Diagnosis not present

## 2020-03-25 DIAGNOSIS — R002 Palpitations: Secondary | ICD-10-CM | POA: Diagnosis not present

## 2020-03-25 DIAGNOSIS — R609 Edema, unspecified: Secondary | ICD-10-CM | POA: Diagnosis not present

## 2020-03-25 DIAGNOSIS — I10 Essential (primary) hypertension: Secondary | ICD-10-CM | POA: Diagnosis not present

## 2020-04-11 ENCOUNTER — Encounter: Payer: Self-pay | Admitting: Cardiology

## 2020-04-11 ENCOUNTER — Ambulatory Visit (INDEPENDENT_AMBULATORY_CARE_PROVIDER_SITE_OTHER): Payer: BC Managed Care – PPO | Admitting: Cardiology

## 2020-04-11 ENCOUNTER — Other Ambulatory Visit: Payer: Self-pay

## 2020-04-11 VITALS — BP 130/70 | HR 81 | Temp 97.0°F | Ht 64.0 in | Wt 196.0 lb

## 2020-04-11 DIAGNOSIS — I10 Essential (primary) hypertension: Secondary | ICD-10-CM | POA: Insufficient documentation

## 2020-04-11 DIAGNOSIS — Z87898 Personal history of other specified conditions: Secondary | ICD-10-CM | POA: Diagnosis not present

## 2020-04-11 DIAGNOSIS — Z7182 Exercise counseling: Secondary | ICD-10-CM

## 2020-04-11 DIAGNOSIS — Z7189 Other specified counseling: Secondary | ICD-10-CM

## 2020-04-11 DIAGNOSIS — E6609 Other obesity due to excess calories: Secondary | ICD-10-CM

## 2020-04-11 DIAGNOSIS — Z713 Dietary counseling and surveillance: Secondary | ICD-10-CM

## 2020-04-11 DIAGNOSIS — E782 Mixed hyperlipidemia: Secondary | ICD-10-CM

## 2020-04-11 DIAGNOSIS — Z6833 Body mass index (BMI) 33.0-33.9, adult: Secondary | ICD-10-CM

## 2020-04-11 DIAGNOSIS — Z8249 Family history of ischemic heart disease and other diseases of the circulatory system: Secondary | ICD-10-CM | POA: Diagnosis not present

## 2020-04-11 NOTE — Patient Instructions (Addendum)
Medication Instructions:  CONTINUE WITH CURRENT MEDICATIONS. NO CHANGES.  *If you need a refill on your cardiac medications before your next appointment, please call your pharmacy*   Lab Work: NONE   Testing/Procedures: NONE   Follow-Up: At [facility], you and your health needs are our priority.  As part of our continuing mission to provide you with exceptional heart care, we have created designated Provider Care Teams.  These Care Teams include your primary Cardiologist (physician) and Advanced Practice Providers (APPs -  Physician Assistants and Nurse Practitioners) who all work together to provide you with the care you need, when you need it.  We recommend signing up for the patient portal called "MyChart".  Sign up information is provided on this After Visit Summary.  MyChart is used to connect with patients for Virtual Visits (Telemedicine).  Patients are able to view lab/test results, encounter notes, upcoming appointments, etc.  Non-urgent messages can be sent to your provider as well.   To learn more about what you can do with MyChart, go to [email or web address].    Your next appointment:   AS NEEDED  The format for your next appointment:   Either In Person or Virtual  Provider:   Jodelle Red, MD       Mediterranean Diet A Mediterranean diet refers to food and lifestyle choices that are based on the traditions of countries located on the [place]. This way of eating has been shown to help prevent certain conditions and improve outcomes for people who have chronic diseases, like kidney disease and heart disease. What are tips for following this plan? Lifestyle  Cook and eat meals together with your family, when possible.  Drink enough fluid to keep your urine clear or pale yellow.  Be physically active every day. This includes: ? Aerobic exercise like running or swimming. ? Leisure activities like gardening, walking, or housework.  Get  7-8 hours of sleep each night.  If recommended by your health care provider, drink red wine in moderation. This means 1 glass a day for nonpregnant women and 2 glasses a day for men. A glass of wine equals 5 oz (150 mL). Reading food labels   Check the serving size of packaged foods. For foods such as rice and pasta, the serving size refers to the amount of cooked product, not dry.  Check the total fat in packaged foods. Avoid foods that have saturated fat or trans fats.  Check the ingredients list for added sugars, such as corn syrup. Shopping  At the grocery store, buy most of your food from the areas near the walls of the store. This includes: ? Fresh fruits and vegetables (produce). ? Grains, beans, nuts, and seeds. Some of these may be available in unpackaged forms or large amounts (in bulk). ? Fresh seafood. ? Poultry and eggs. ? Low-fat dairy products.  Buy whole ingredients instead of prepackaged foods.  Buy fresh fruits and vegetables in-season from local farmers markets.  Buy frozen fruits and vegetables in resealable bags.  If you do not have access to quality fresh seafood, buy precooked frozen shrimp or canned fish, such as tuna, salmon, or sardines.  Buy small amounts of raw or cooked vegetables, salads, or olives from the deli or salad bar at your store.  Stock your pantry so you always have certain foods on hand, such as olive oil, canned tuna, canned tomatoes, rice, pasta, and beans. Cooking  Cook foods with extra-virgin olive oil instead of using butter or other  vegetable oils.  Have meat as a side dish, and have vegetables or grains as your main dish. This means having meat in small portions or adding small amounts of meat to foods like pasta or stew.  Use beans or vegetables instead of meat in common dishes like chili or lasagna.  Experiment with different cooking methods. Try roasting or broiling vegetables instead of steaming or sauteing them.  Add  frozen vegetables to soups, stews, pasta, or rice.  Add nuts or seeds for added healthy fat at each meal. You can add these to yogurt, salads, or vegetable dishes.  Marinate fish or vegetables using olive oil, lemon juice, garlic, and fresh herbs. Meal planning   Plan to eat 1 vegetarian meal one day each week. Try to work up to 2 vegetarian meals, if possible.  Eat seafood 2 or more times a week.  Have healthy snacks readily available, such as: ? Vegetable sticks with hummus. ? [place] yogurt. ? Fruit and nut trail mix.  Eat balanced meals throughout the week. This includes: ? Fruit: 2-3 servings a day ? Vegetables: 4-5 servings a day ? Low-fat dairy: 2 servings a day ? Fish, poultry, or lean meat: 1 serving a day ? Beans and legumes: 2 or more servings a week ? Nuts and seeds: 1-2 servings a day ? Whole grains: 6-8 servings a day ? Extra-virgin olive oil: 3-4 servings a day  Limit red meat and sweets to only a few servings a month What are my food choices?  Mediterranean diet ? Recommended  Grains: Whole-grain pasta. Brown rice. Bulgar wheat. Polenta. Couscous. Whole-wheat bread. [place].  Vegetables: Artichokes. Beets. Broccoli. Cabbage. Carrots. Eggplant. Green beans. Chard. Kale. Spinach. Onions. Leeks. Peas. Squash. Tomatoes. Peppers. Radishes.  Fruits: Apples. Apricots. Avocado. Berries. Bananas. Cherries. Dates. Figs. Grapes. Lemons. Melon. Oranges. Peaches. Plums. Pomegranate.  Meats and other protein foods: Beans. Almonds. Sunflower seeds. Pine nuts. Peanuts. [place]. Salmon. Scallops. Shrimp. [place]. Tilapia. Clams. Oysters. Eggs.  Dairy: Low-fat milk. Cheese. Greek yogurt.  Beverages: Water. Red wine. Herbal tea.  Fats and oils: Extra virgin olive oil. Avocado oil. Grape seed oil.  Sweets and desserts: [place] yogurt with honey. Baked apples. Poached pears. Trail mix.  Seasoning and other foods: Basil. Cilantro. Coriander. Cumin. Mint. Parsley. Sage.  Rosemary. Tarragon. Garlic. Oregano. Thyme. Pepper. Balsalmic vinegar. Tahini. Hummus. Tomato sauce. Olives. Mushrooms. ? Limit these  Grains: Prepackaged pasta or rice dishes. Prepackaged cereal with added sugar.  Vegetables: Deep fried potatoes (french fries).  Fruits: Fruit canned in syrup.  Meats and other protein foods: Beef. Pork. Lamb. Poultry with skin. Hot dogs. Berniece Salines.  Dairy: Ice cream. Sour cream. Whole milk.  Beverages: Juice. Sugar-sweetened soft drinks. Beer. Liquor and spirits.  Fats and oils: Butter. Canola oil. Vegetable oil. Beef fat (tallow). Lard.  Sweets and desserts: Cookies. Cakes. Pies. Candy.  Seasoning and other foods: Mayonnaise. Premade sauces and marinades. The items listed may not be a complete list. Talk with your dietitian about what dietary choices are right for you. Summary  The Mediterranean diet includes both food and lifestyle choices.  Eat a variety of fresh fruits and vegetables, beans, nuts, seeds, and whole grains.  Limit the amount of red meat and sweets that you eat.  Talk with your health care provider about whether it is safe for you to drink red wine in moderation. This means 1 glass a day for nonpregnant women and 2 glasses a day for men. A glass of wine equals 5 oz (  150 mL). This information is not intended to replace advice given to you by your health care provider. Make sure you discuss any questions you have with your health care provider. Document Revised: 07/09/2016 Document Reviewed: 07/02/2016 Elsevier Patient Education  2020 [employer].

## 2020-04-11 NOTE — Progress Notes (Signed)
Cardiology Office Note:    Date:  04/11/2020   ID:  Lynn Crawford, DOB 25-Mar-1966, MRN [id number]  PCP:  Tally Joe, MD  Cardiologist:  Jodelle Red, MD  Referring MD: Tally Joe, MD   CC: new patient consultation for lower extremity edema  History of Present Illness:    Lynn Crawford is a 54 y.o. female with a hx of hypertension who is seen as a new consult at the request of Tally Joe, MD for the evaluation and management of LE edema.  I do not have the complete note from her recent visit with Dr. Merita Norton office on 03/25/20, but I do have a summary of the assessment/plan and labs. Noted to have bilateral LE edema. BNP 7. Renal function and liver function within normal limits. Amlodipine dose decreased. Also noted to have intermittent palpitations.   Notes that legs have been swollen since around Easter. Since the amlodipine was reduced, leg swelling is gone. Also had intermittent palpitations, resolved since stopping amlodipine.  Has treadmill at home, notes that after 1-2 minutes of walking her heart beats fast.   Has chest "tingle" a few times a week, lasts only briefly. Not exertional, no clear triggers. No clear alleviating factors. No associated symptoms. Moves around her chest, never in the same place.  Denies shortness of breath at rest or with normal exertion. No PND, orthopnea, or unexpected weight gain. No syncope.  Cardiovascular risk factors: Prior clinical ASCVD: none Comorbid conditions: Endorses hypertension, hyperlipidemia. Denies diabetes, chronic kidney disease Metabolic syndrome/Obesity: BMI 33 Chronic inflammatory conditions: none Tobacco use history: distant use, none in >10 years Family history: older brother had MI >29 years old. No other history that she knows of. Prior cardiac testing and/or incidental findings on other testing (ie coronary calcium): none Exercise level: can hike in the mountains for about an hour. Current  diet: eats fruits and vegetables. Tries to eat whole grains. Avoids sweets, cutting back on fried foods.  PMH: hypertension  Past Surgical History:  Procedure Laterality Date  . CERVICAL BIOPSY  W/ LOOP ELECTRODE EXCISION     x2    Current Medications: Current Outpatient Medications on File Prior to Visit  Medication Sig  . amLODipine (NORVASC) 5 MG tablet Take 5 mg by mouth daily.  . hydrochlorothiazide (MICROZIDE) 12.5 MG capsule Take 12.5 mg by mouth daily.  . metoprolol tartrate (LOPRESSOR) 25 MG tablet Take 25 mg by mouth 2 (two) times daily.  . pantoprazole (PROTONIX) 40 MG tablet Take 40 mg by mouth daily.  . valACYclovir (VALTREX) 1000 MG tablet TAKE 1/2 TABLET(500 MG) BY MOUTH DAILY   No current facility-administered medications on file prior to visit.     Allergies:   Patient has no known allergies.   Social History   Tobacco Use  . Smoking status: Never Smoker  . Smokeless tobacco: Never Used  Substance Use Topics  . Alcohol use: Yes    Comment: glass of wine with dinner  . Drug use: Not on file    Family History: family history includes Breast cancer in an other family member; Cancer in her brother; Diabetes in her mother; Hypertension in her brother, father, mother, and sister.  ROS:   Please see the history of present illness.  Additional pertinent ROS: Constitutional: Negative for chills, fever, night sweats, unintentional weight loss  HENT: Negative for ear pain and hearing loss.   Eyes: Negative for loss of vision and eye pain.  Respiratory: Negative for cough, sputum, wheezing.  Cardiovascular: See HPI. Gastrointestinal: Negative for abdominal pain, melena, and hematochezia.  Genitourinary: Negative for dysuria and hematuria.  Musculoskeletal: Negative for falls and myalgias.  Skin: Negative for itching and rash.  Neurological: Negative for focal weakness, focal sensory changes and loss of consciousness.  Endo/Heme/Allergies: Does not bruise/bleed  easily.     EKGs/Labs/Other Studies Reviewed:    The following studies were reviewed today: No prior cardiac studies  EKG:  EKG is personally reviewed.  The ekg ordered today demonstrates NSR, low voltage  Recent Labs: No results found for requested labs within last 8760 hours.  Recent Lipid Panel No results found for: CHOL, TRIG, HDL, CHOLHDL, VLDL, LDLCALC, LDLDIRECT  Physical Exam:    VS:  BP 130/70   Pulse 81   Temp (!) 97 F (36.1 C)   Ht 5\' 4"  (1.626 m)   Wt 196 lb (88.9 kg)   SpO2 98%   BMI 33.64 kg/m     Wt Readings from Last 3 Encounters:  04/19/19 192 lb (87.1 kg)  04/05/18 190 lb (86.2 kg)    GEN: Well nourished, well developed in no acute distress HEENT: Normal, moist mucous membranes NECK: No JVD CARDIAC: regular rhythm, normal S1 and S2, no rubs or gallops. No murmurs. VASCULAR: Radial and DP pulses 2+ bilaterally. No carotid bruits RESPIRATORY:  Clear to auscultation without rales, wheezing or rhonchi  ABDOMEN: Soft, non-tender, non-distended MUSCULOSKELETAL:  Ambulates independently SKIN: Warm and dry, no edema NEUROLOGIC:  Alert and oriented x 3. No focal neuro deficits noted. PSYCHIATRIC:  Normal affect    ASSESSMENT:    1. History of edema   2. Essential hypertension   3. Mixed hyperlipidemia   4. Family history of heart disease   5. Cardiac risk counseling   6. Counseling on health promotion and disease prevention   7. Nutritional counseling   8. Exercise counseling   9. Class 1 obesity due to excess calories without serious comorbidity with body mass index (BMI) of 33.0 to 33.9 in adult    PLAN:    History of LE edema:  -none on exam today -recent BNP normal -improvement with change in amlodipine dose -no indication for further workup at this time, but counseled on red flag warning signs for which she should call the office or seek urgent care  Hypertension: just at goal today -continue amlodipine 5 mg daily, HCTZ 25 mg  daily -continue metoprolol succinate 25 mg daily for now, but this would be the first medication I would change if needed  Mixed hyperlipidemia: -per [facility], Tchol 214, HDL 56, LDL 146, TG 194 08/2019 -we discussed prevention and statins today. Her ASCVD risk score is 4.9% -she wants to work on diet, exercise, and weight loss before considering medication  Class 1 obesity: -reviewed recommendations for diet, exercise per AHA -discussed that often weight loss takes more than just what the guidelines recommend -gave materials on mediterranean diet today  Cardiac risk counseling and prevention recommendations: has family history of heart disease as risk factor -recommend heart healthy/Mediterranean diet, with whole grains, fruits, vegetable, fish, lean meats, nuts, and olive oil. Limit salt. We reviewed diet recommendations at length -recommend moderate walking, 3-5 times/week for 30-50 minutes each session. Aim for at least 150 minutes.week. Goal should be pace of 3 miles/hours, or walking 1.5 miles in 30 minutes -recommend avoidance of tobacco products. Avoid excess alcohol. -ASCVD risk score: 4.9% 10 year risk today  Plan for follow up: as needed  09/2019, MD, PhD  [place]  [facility]    Medication Adjustments/Labs and Tests Ordered: Current medicines are reviewed at length with the patient today.  Concerns regarding medicines are outlined above.  Orders Placed This Encounter  Procedures  . EKG 12-Lead   No orders of the defined types were placed in this encounter.   Patient Instructions  Medication Instructions:  CONTINUE WITH CURRENT MEDICATIONS. NO CHANGES.  *If you need a refill on your cardiac medications before your next appointment, please call your pharmacy*   Lab Work: NONE   Testing/Procedures: NONE   Follow-Up: At [facility], you and your health needs are our priority.  As part of our continuing mission to provide you with exceptional  heart care, we have created designated Provider Care Teams.  These Care Teams include your primary Cardiologist (physician) and Advanced Practice Providers (APPs -  Physician Assistants and Nurse Practitioners) who all work together to provide you with the care you need, when you need it.  We recommend signing up for the patient portal called "MyChart".  Sign up information is provided on this After Visit Summary.  MyChart is used to connect with patients for Virtual Visits (Telemedicine).  Patients are able to view lab/test results, encounter notes, upcoming appointments, etc.  Non-urgent messages can be sent to your provider as well.   To learn more about what you can do with MyChart, go to [email or web address].    Your next appointment:   AS NEEDED  The format for your next appointment:   Either In Person or Virtual  Provider:   Jodelle Red, MD       Mediterranean Diet A Mediterranean diet refers to food and lifestyle choices that are based on the traditions of countries located on the [place]. This way of eating has been shown to help prevent certain conditions and improve outcomes for people who have chronic diseases, like kidney disease and heart disease. What are tips for following this plan? Lifestyle  Cook and eat meals together with your family, when possible.  Drink enough fluid to keep your urine clear or pale yellow.  Be physically active every day. This includes: ? Aerobic exercise like running or swimming. ? Leisure activities like gardening, walking, or housework.  Get 7-8 hours of sleep each night.  If recommended by your health care provider, drink red wine in moderation. This means 1 glass a day for nonpregnant women and 2 glasses a day for men. A glass of wine equals 5 oz (150 mL). Reading food labels   Check the serving size of packaged foods. For foods such as rice and pasta, the serving size refers to the amount of cooked product,  not dry.  Check the total fat in packaged foods. Avoid foods that have saturated fat or trans fats.  Check the ingredients list for added sugars, such as corn syrup. Shopping  At the grocery store, buy most of your food from the areas near the walls of the store. This includes: ? Fresh fruits and vegetables (produce). ? Grains, beans, nuts, and seeds. Some of these may be available in unpackaged forms or large amounts (in bulk). ? Fresh seafood. ? Poultry and eggs. ? Low-fat dairy products.  Buy whole ingredients instead of prepackaged foods.  Buy fresh fruits and vegetables in-season from local farmers markets.  Buy frozen fruits and vegetables in resealable bags.  If you do not have access to quality fresh seafood, buy precooked frozen shrimp or canned fish, such as tuna, salmon, or  sardines.  Buy small amounts of raw or cooked vegetables, salads, or olives from the deli or salad bar at your store.  Stock your pantry so you always have certain foods on hand, such as olive oil, canned tuna, canned tomatoes, rice, pasta, and beans. Cooking  Cook foods with extra-virgin olive oil instead of using butter or other vegetable oils.  Have meat as a side dish, and have vegetables or grains as your main dish. This means having meat in small portions or adding small amounts of meat to foods like pasta or stew.  Use beans or vegetables instead of meat in common dishes like chili or lasagna.  Experiment with different cooking methods. Try roasting or broiling vegetables instead of steaming or sauteing them.  Add frozen vegetables to soups, stews, pasta, or rice.  Add nuts or seeds for added healthy fat at each meal. You can add these to yogurt, salads, or vegetable dishes.  Marinate fish or vegetables using olive oil, lemon juice, garlic, and fresh herbs. Meal planning   Plan to eat 1 vegetarian meal one day each week. Try to work up to 2 vegetarian meals, if possible.  Eat seafood  2 or more times a week.  Have healthy snacks readily available, such as: ? Vegetable sticks with hummus. ? [place] yogurt. ? Fruit and nut trail mix.  Eat balanced meals throughout the week. This includes: ? Fruit: 2-3 servings a day ? Vegetables: 4-5 servings a day ? Low-fat dairy: 2 servings a day ? Fish, poultry, or lean meat: 1 serving a day ? Beans and legumes: 2 or more servings a week ? Nuts and seeds: 1-2 servings a day ? Whole grains: 6-8 servings a day ? Extra-virgin olive oil: 3-4 servings a day  Limit red meat and sweets to only a few servings a month What are my food choices?  Mediterranean diet ? Recommended  Grains: Whole-grain pasta. Brown rice. Bulgar wheat. Polenta. Couscous. Whole-wheat bread. [place] Cob[place]meal. Quinoa.  Vegetables: Artichokes. Beets. Broccoli. Cabbage. Carrots. Eggplant. Green beans. Chard. Kale. Spinach. Onions. Leeks. Peas. Squash. Tomatoes. Peppers. Radishes.  Fruits: Apples. Apricots. Avocado. Berries. Bananas. Cherries. Dates. Figs. Grapes. Lemons. Melon. Oranges. Peaches. Plums. Pomegranate.  Meats and other protein foods: Beans. Almonds. Sunflower seeds. Pine nuts. Peanuts. Cod. Salmon. Scallops. Shrimp. Tuna. Tilapia. Clams. Oysters. Eggs.  Dairy: Low-fat milk. Cheese. Greek yogurt.  Beverages: Water. Red wine. Herbal tea.  Fats and oils: Extra virgin olive oil. Avocado oil. Grape seed oil.  Sweets and desserts: [place] yogurt with honey. Baked apples. Poached pears. Trail mix.  Seasoning and other foods: Basil. Cilantro. Coriander. Cumin. Mint. Parsley. Sage. Rosemary. Tarragon. Garlic. Oregano. Thyme. Pepper. Balsalmic vinegar. Tahini. Hummus. Tomato sauce. Olives. Mushrooms. ? Limit these  Grains: Prepackaged pasta or rice dishes. Prepackaged cereal with added sugar.  Vegetables: Deep fried potatoes (french fries).  Fruits: Fruit canned in syrup.  Meats and other protein foods: Beef. Pork. Lamb. Poultry with skin. Hot dogs.  Tomasa BlaseBacon.  Dairy: Ice cream. Sour cream. Whole milk.  Beverages: Juice. Sugar-sweetened soft drinks. Beer. Liquor and spirits.  Fats and oils: Butter. Canola oil. Vegetable oil. Beef fat (tallow). Lard.  Sweets and desserts: Cookies. Cakes. Pies. Candy.  Seasoning and other foods: Mayonnaise. Premade sauces and marinades. The items listed may not be a complete list. Talk with your dietitian about what dietary choices are right for you. Summary  The Mediterranean diet includes both food and lifestyle choices.  Eat a variety of fresh fruits and vegetables, beans,  nuts, seeds, and whole grains.  Limit the amount of red meat and sweets that you eat.  Talk with your health care provider about whether it is safe for you to drink red wine in moderation. This means 1 glass a day for nonpregnant women and 2 glasses a day for men. A glass of wine equals 5 oz (150 mL). This information is not intended to replace advice given to you by your health care provider. Make sure you discuss any questions you have with your health care provider. Document Revised: 07/09/2016 Document Reviewed: 07/02/2016 Elsevier Patient Education  2020 [employer].    Signed, Buford Dresser, MD PhD 04/11/2020    [place]

## 2020-04-18 ENCOUNTER — Other Ambulatory Visit: Payer: Self-pay

## 2020-04-19 ENCOUNTER — Encounter: Payer: Self-pay | Admitting: Obstetrics & Gynecology

## 2020-04-19 ENCOUNTER — Ambulatory Visit (INDEPENDENT_AMBULATORY_CARE_PROVIDER_SITE_OTHER): Payer: BC Managed Care – PPO | Admitting: Obstetrics & Gynecology

## 2020-04-19 VITALS — BP 128/76 | Ht 63.5 in | Wt 199.0 lb

## 2020-04-19 DIAGNOSIS — Z6834 Body mass index (BMI) 34.0-34.9, adult: Secondary | ICD-10-CM | POA: Diagnosis not present

## 2020-04-19 DIAGNOSIS — Z01419 Encounter for gynecological examination (general) (routine) without abnormal findings: Secondary | ICD-10-CM

## 2020-04-19 DIAGNOSIS — E6609 Other obesity due to excess calories: Secondary | ICD-10-CM | POA: Diagnosis not present

## 2020-04-19 DIAGNOSIS — Z30431 Encounter for routine checking of intrauterine contraceptive device: Secondary | ICD-10-CM

## 2020-04-19 NOTE — Patient Instructions (Signed)
1. Well female exam with routine gynecological exam Normal gynecologic exam.  Pap test negative in May 2019, will repeat at 3 years.  Breast exam normal.  Screening mammogram January 2001 was negative on the right and the left diagnostic mammogram with ultrasound was benign in February 2021.  Will refer patient to gastroenterology for her first screening colonoscopy.  Health labs with family physician.  2. Encounter for routine checking of intrauterine contraceptive device (IUD) Well on Mirena IUD.  IUD in good location.  3. Class 1 obesity due to excess calories with serious comorbidity and body mass index (BMI) of 34.0 to 34.9 in adult Recommend a lower calorie/carb diet such as [employer].  Aerobic activities 5 times a week and light weightlifting every 2 days.  Lynn Crawford, it was a pleasure seeing you today!

## 2020-04-19 NOTE — Progress Notes (Signed)
Lynn Crawford [birth year] [id number]   History:    54 y.o. G3P1A2L1 Divorced.  8 yo grand-child.  RP:  Established patient presenting for annual gyn exam   HPI: Well on Mirena IUD x 04/2019.  Sexually active, strict condom use.  No pelvic pain.  Normal vaginal secretions.  Urine/BMs normal.  Breasts normal.  BMI 34.7.  Not exercising regularly.  Health labs at work.  No Colono so far.   Past medical history,surgical history, family history and social history were all reviewed and documented in the EPIC chart.  Gynecologic History No LMP recorded. (Menstrual status: IUD).  Obstetric History OB History  Gravida Para Term Preterm AB Living  3 1     2 1   SAB TAB Ectopic Multiple Live Births               # Outcome Date GA Lbr Len/2nd Weight Sex Delivery Anes PTL Lv  3 AB           2 AB           1 Para              ROS: A ROS was performed and pertinent positives and negatives are included in the history.  GENERAL: No fevers or chills. HEENT: No change in vision, no earache, sore throat or sinus congestion. NECK: No pain or stiffness. CARDIOVASCULAR: No chest pain or pressure. No palpitations. PULMONARY: No shortness of breath, cough or wheeze. GASTROINTESTINAL: No abdominal pain, nausea, vomiting or diarrhea, melena or bright red blood per rectum. GENITOURINARY: No urinary frequency, urgency, hesitancy or dysuria. MUSCULOSKELETAL: No joint or muscle pain, no back pain, no recent trauma. DERMATOLOGIC: No rash, no itching, no lesions. ENDOCRINE: No polyuria, polydipsia, no heat or cold intolerance. No recent change in weight. HEMATOLOGICAL: No anemia or easy bruising or bleeding. NEUROLOGIC: No headache, seizures, numbness, tingling or weakness. PSYCHIATRIC: No depression, no loss of interest in normal activity or change in sleep pattern.     Exam:   BP 128/76   Ht 5' 3.5" (1.613 m)   Wt 199 lb (90.3 kg)   BMI 34.70 kg/m   Body mass index is 34.7 kg/m.  General  appearance : Well developed well nourished female. No acute distress HEENT: Eyes: no retinal hemorrhage or exudates,  Neck supple, trachea midline, no carotid bruits, no thyroidmegaly Lungs: Clear to auscultation, no rhonchi or wheezes, or rib retractions  Heart: Regular rate and rhythm, no murmurs or gallops Breast:Examined in sitting and supine position were symmetrical in appearance, no palpable masses or tenderness,  no skin retraction, no nipple inversion, no nipple discharge, no skin discoloration, no axillary or supraclavicular lymphadenopathy Abdomen: no palpable masses or tenderness, no rebound or guarding Extremities: no edema or skin discoloration or tenderness  Pelvic: Vulva: Normal             Vagina: No gross lesions or discharge  Cervix: No gross lesions or discharge.  IUD strings felt at the external os.  Uterus  AV, normal size, shape and consistency, non-tender and mobile  Adnexa  Without masses or tenderness  Anus: Normal   Assessment/Plan:  54 y.o. female for annual exam   1. Well female exam with routine gynecological exam Normal gynecologic exam.  Pap test negative in May 2019, will repeat at 3 years.  Breast exam normal.  Screening mammogram January 2001 was negative on the right and the left diagnostic mammogram with ultrasound was benign in February 2021.  Will refer patient to gastroenterology for her first screening colonoscopy.  Health labs with family physician.  2. Encounter for routine checking of intrauterine contraceptive device (IUD) Well on Mirena IUD.  IUD in good location.  3. Class 1 obesity due to excess calories with serious comorbidity and body mass index (BMI) of 34.0 to 34.9 in adult Recommend a lower calorie/carb diet such as [employer].  Aerobic activities 5 times a week and light weightlifting every 2 days.  Princess Bruins MD, 12:18 PM 04/19/2020

## 2020-04-24 ENCOUNTER — Telehealth: Payer: Self-pay | Admitting: *Deleted

## 2020-04-24 NOTE — Telephone Encounter (Signed)
-----   Message from Genia Del, MD sent at 04/19/2020  1:28 PM EDT ----- Regarding: Refer to Gastro 54 yo no Screening Colonoscopy yet.  Per patient, Lynn Crawford never called her to schedule.  Can you make sure she gets an appointment this year?

## 2020-04-24 NOTE — Telephone Encounter (Signed)
I called patient and left a detailed message on cell to call [facility] at [phone], no referral needed. I see when I sent my chart message on 05/22/20 patient did read the message, I explained she can call to schedule with this message.

## 2020-05-27 ENCOUNTER — Encounter: Payer: Self-pay | Admitting: Cardiology

## 2020-12-10 DIAGNOSIS — H5213 Myopia, bilateral: Secondary | ICD-10-CM | POA: Diagnosis not present

## 2020-12-10 DIAGNOSIS — H524 Presbyopia: Secondary | ICD-10-CM | POA: Diagnosis not present

## 2021-04-22 DIAGNOSIS — L218 Other seborrheic dermatitis: Secondary | ICD-10-CM | POA: Diagnosis not present

## 2021-05-05 ENCOUNTER — Other Ambulatory Visit: Payer: Self-pay | Admitting: Obstetrics & Gynecology

## 2021-05-05 DIAGNOSIS — Z1231 Encounter for screening mammogram for malignant neoplasm of breast: Secondary | ICD-10-CM

## 2021-05-09 ENCOUNTER — Other Ambulatory Visit: Payer: Self-pay

## 2021-05-09 ENCOUNTER — Ambulatory Visit
Admission: RE | Admit: 2021-05-09 | Discharge: 2021-05-09 | Disposition: A | Discharge status: Home / Self Care | Payer: BC Managed Care – PPO | Source: Ambulatory Visit | Attending: Obstetrics & Gynecology | Admitting: Obstetrics & Gynecology

## 2021-05-09 DIAGNOSIS — Z1231 Encounter for screening mammogram for malignant neoplasm of breast: Secondary | ICD-10-CM

## 2021-12-10 DIAGNOSIS — H524 Presbyopia: Secondary | ICD-10-CM | POA: Diagnosis not present

## 2021-12-10 DIAGNOSIS — H5213 Myopia, bilateral: Secondary | ICD-10-CM | POA: Diagnosis not present

## 2022-01-22 DIAGNOSIS — K219 Gastro-esophageal reflux disease without esophagitis: Secondary | ICD-10-CM | POA: Diagnosis not present

## 2022-01-22 DIAGNOSIS — Z0000 Encounter for general adult medical examination without abnormal findings: Secondary | ICD-10-CM | POA: Diagnosis not present

## 2022-01-22 DIAGNOSIS — E782 Mixed hyperlipidemia: Secondary | ICD-10-CM | POA: Diagnosis not present

## 2022-01-22 DIAGNOSIS — I10 Essential (primary) hypertension: Secondary | ICD-10-CM | POA: Diagnosis not present

## 2022-01-22 DIAGNOSIS — A6004 Herpesviral vulvovaginitis: Secondary | ICD-10-CM | POA: Diagnosis not present

## 2022-01-29 ENCOUNTER — Ambulatory Visit (INDEPENDENT_AMBULATORY_CARE_PROVIDER_SITE_OTHER): Payer: BC Managed Care – PPO | Admitting: Obstetrics & Gynecology

## 2022-01-29 ENCOUNTER — Other Ambulatory Visit (HOSPITAL_COMMUNITY)
Admission: RE | Admit: 2022-01-29 | Discharge: 2022-01-29 | Disposition: A | Discharge status: Home / Self Care | Payer: BC Managed Care – PPO | Source: Ambulatory Visit | Attending: Obstetrics & Gynecology | Admitting: Obstetrics & Gynecology

## 2022-01-29 ENCOUNTER — Other Ambulatory Visit: Payer: Self-pay

## 2022-01-29 ENCOUNTER — Encounter: Payer: Self-pay | Admitting: Obstetrics & Gynecology

## 2022-01-29 VITALS — BP 114/70 | HR 68 | Resp 16 | Ht 63.75 in | Wt 189.0 lb

## 2022-01-29 DIAGNOSIS — Z01419 Encounter for gynecological examination (general) (routine) without abnormal findings: Secondary | ICD-10-CM | POA: Insufficient documentation

## 2022-01-29 DIAGNOSIS — Z6832 Body mass index (BMI) 32.0-32.9, adult: Secondary | ICD-10-CM

## 2022-01-29 DIAGNOSIS — E6609 Other obesity due to excess calories: Secondary | ICD-10-CM | POA: Diagnosis not present

## 2022-01-29 DIAGNOSIS — Z30431 Encounter for routine checking of intrauterine contraceptive device: Secondary | ICD-10-CM | POA: Diagnosis not present

## 2022-01-29 NOTE — Progress Notes (Signed)
? ? ?Lynn Crawford 06-May-1966 [id number] ? ? ?History:    56 y.o. G3P1A2L1 Divorced. Stable boyfriend x 4 years.  9+ yo grand-child. ?  ?RP:  Established patient presenting for annual gyn exam  ?  ?HPI: Well on Mirena IUD x 04/2019.  Sexually active, strict condom use.  No pelvic pain.  Normal vaginal secretions.  Pap Neg in 03/2018.  Pap reflex today.  Urine/BMs normal.  Breasts normal. Mammo Neg 04/2021.  BMI 32.7.  Not exercising regularly.  Health labs with Fam MD.  Will schedule Colono through Fam MD. ? ? ?Past medical history,surgical history, family history and social history were all reviewed and documented in the EPIC chart. ? ?Gynecologic History ?No LMP recorded. (Menstrual status: IUD). ? ?Obstetric History ?OB History  ?Gravida Para Term Preterm AB Living  ?3 1     2 1   ?SAB IAB Ectopic Multiple Live Births  ?           ?  ?# Outcome Date GA Lbr Len/2nd Weight Sex Delivery Anes PTL Lv  ?3 AB           ?2 AB           ?1 Para           ? ? ? ?ROS: A ROS was performed and pertinent positives and negatives are included in the history. ? GENERAL: No fevers or chills. HEENT: No change in vision, no earache, sore throat or sinus congestion. NECK: No pain or stiffness. CARDIOVASCULAR: No chest pain or pressure. No palpitations. PULMONARY: No shortness of breath, cough or wheeze. GASTROINTESTINAL: No abdominal pain, nausea, vomiting or diarrhea, melena or bright red blood per rectum. GENITOURINARY: No urinary frequency, urgency, hesitancy or dysuria. MUSCULOSKELETAL: No joint or muscle pain, no back pain, no recent trauma. DERMATOLOGIC: No rash, no itching, no lesions. ENDOCRINE: No polyuria, polydipsia, no heat or cold intolerance. No recent change in weight. HEMATOLOGICAL: No anemia or easy bruising or bleeding. NEUROLOGIC: No headache, seizures, numbness, tingling or weakness. PSYCHIATRIC: No depression, no loss of interest in normal activity or change in sleep pattern.  ?  ? ?Exam: ? ? ?BP 114/70   Pulse  68   Resp 16   Ht 5' 3.75" (1.619 m)   Wt 189 lb (85.7 kg)   BMI 32.70 kg/m?  ? ?Body mass index is 32.7 kg/m?. ? ?General appearance : Well developed well nourished female. No acute distress ?HEENT: Eyes: no retinal hemorrhage or exudates,  Neck supple, trachea midline, no carotid bruits, no thyroidmegaly ?Lungs: Clear to auscultation, no rhonchi or wheezes, or rib retractions  ?Heart: Regular rate and rhythm, no murmurs or gallops ?Breast:Examined in sitting and supine position were symmetrical in appearance, no palpable masses or tenderness,  no skin retraction, no nipple inversion, no nipple discharge, no skin discoloration, no axillary or supraclavicular lymphadenopathy ?Abdomen: no palpable masses or tenderness, no rebound or guarding ?Extremities: no edema or skin discoloration or tenderness ? ?Pelvic: Vulva: Normal ?            Vagina: No gross lesions or discharge ? Cervix: No gross lesions or discharge.  IUD strings visible at [facility].  Pap reflex done. ? Uterus  AV, normal size, shape and consistency, non-tender and mobile ? Adnexa  Without masses or tenderness ? Anus: Normal ? ? ?Assessment/Plan:  56 y.o. female for annual exam  ? ?1. Encounter for routine gynecological examination with Papanicolaou smear of cervix ?Well on Mirena IUD x 04/2019.  Sexually active, strict condom use.  No pelvic pain.  Normal vaginal secretions.  Pap Neg in 03/2018.  Pap reflex today.  Urine/BMs normal.  Breasts normal. Mammo Neg 04/2021.  BMI 32.7.  Not exercising regularly.  Health labs with Fam MD.  Will schedule Colono through Fam MD. ?[facility] ? ?2. Encounter for routine checking of intrauterine contraceptive device (IUD) ?Well on Mirena IUD x 04/2019.  IUD in good location. ? ?3. Class 1 obesity due to excess calories with serious comorbidity and body mass index (BMI) of 32.0 to 32.9 in adult ?Low calorie/carb diet.  Increase fitness activities. ? ?Other orders ?- amLODipine (NORVASC) 10 MG tablet;  Take 5 mg by mouth daily. ?- atorvastatin (LIPITOR) 10 MG tablet; Take 10 mg by mouth daily. ?- fluticasone (FLONASE) 50 MCG/ACT nasal spray; Place 1 spray into both nostrils daily. ?- ibuprofen (ADVIL) 600 MG tablet; Take 600 mg by mouth every 6 (six) hours as needed. ?- hydrochlorothiazide (HYDRODIURIL) 12.5 MG tablet; Take 12.5 mg by mouth daily.  ? ?Genia Del MD, 10:31 AM 01/29/2022 ? ?  ?

## 2022-02-03 LAB — CYTOLOGY - PAP
Adequacy: ABSENT
Comment: NEGATIVE
Diagnosis: NEGATIVE
High risk HPV: NEGATIVE

## 2022-03-26 IMAGING — MG MM DIGITAL SCREENING BILAT W/ TOMO AND CAD
8 series · 8 of 24 positions shown · non-contrast
Comparison: Previous exam(s).

CLINICAL DATA: Screening.

EXAM:
DIGITAL SCREENING BILATERAL MAMMOGRAM WITH TOMOSYNTHESIS AND CAD
TECHNIQUE: Bilateral screening digital craniocaudal and mediolateral oblique
mammograms were obtained. Bilateral screening digital breast
tomosynthesis was performed. The images were evaluated with
computer-aided detection.

[L MLO synth-2D]
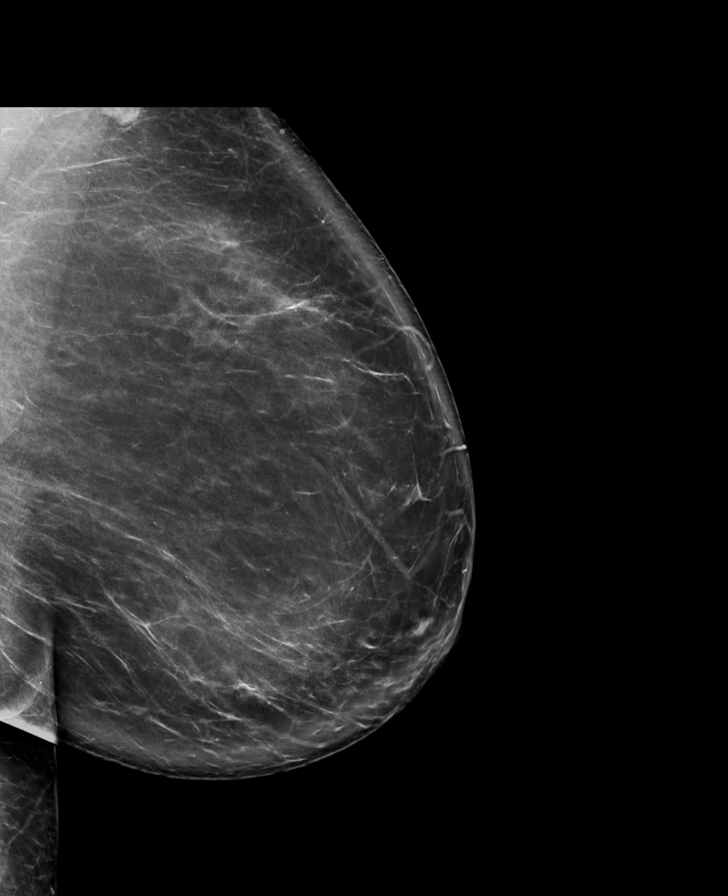

[L CC synth-2D]
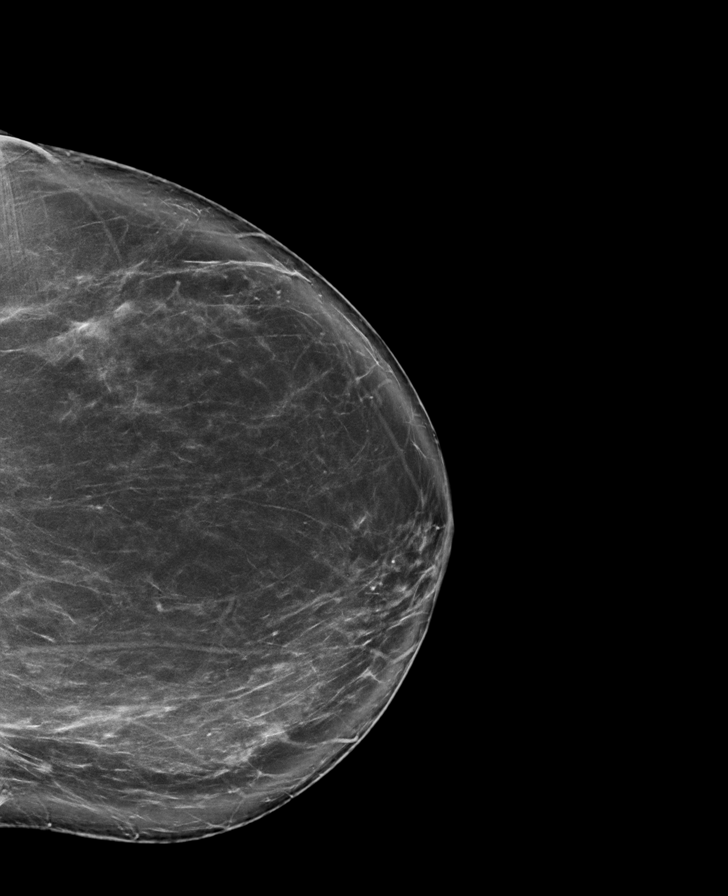

[R CC synth-2D]
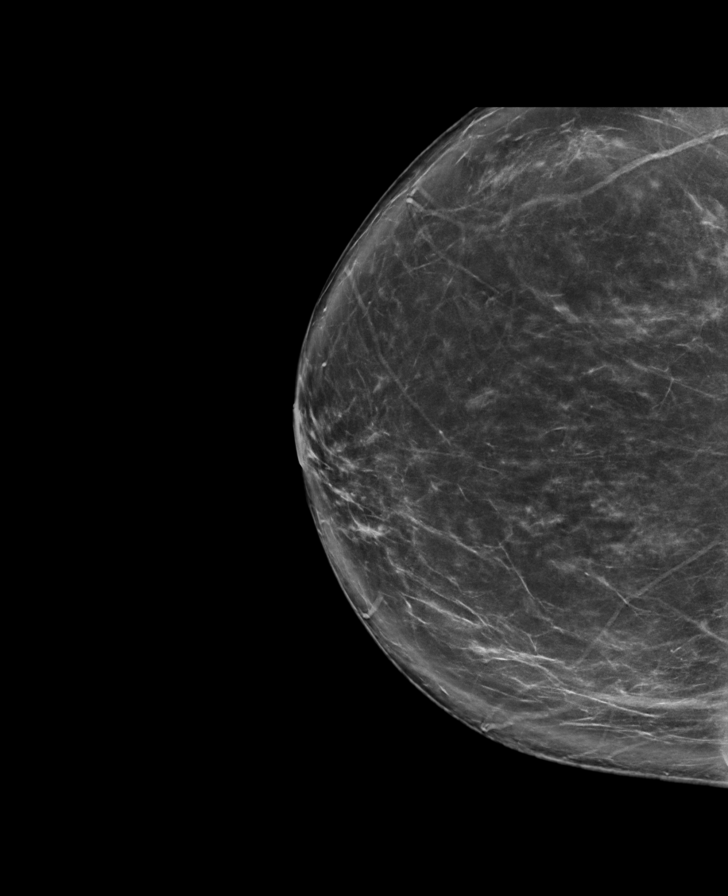

[R MLO synth-2D]
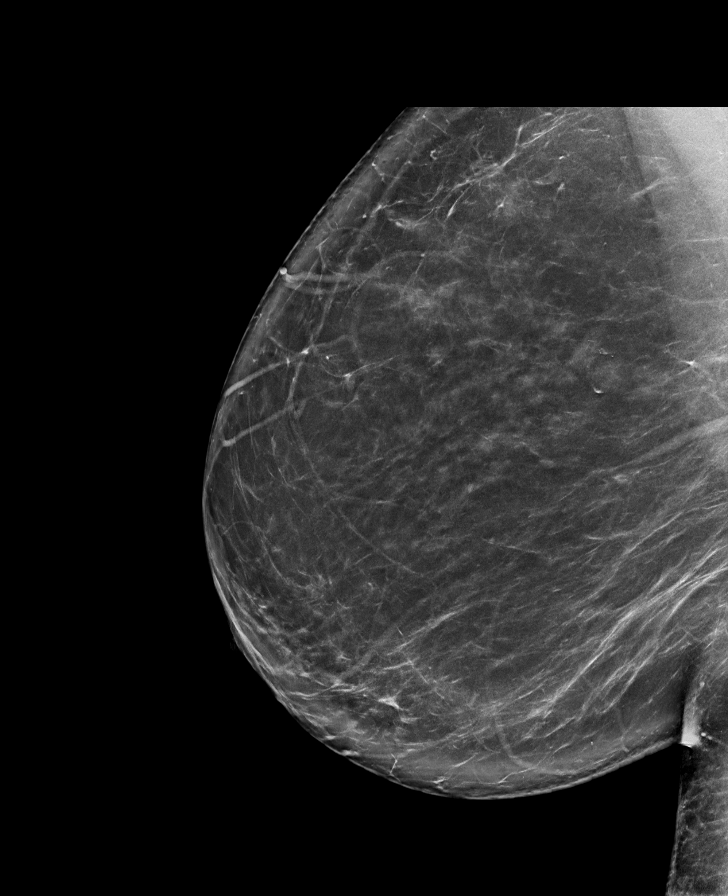

[R MLO tomo · tomo slice 51/102.0]
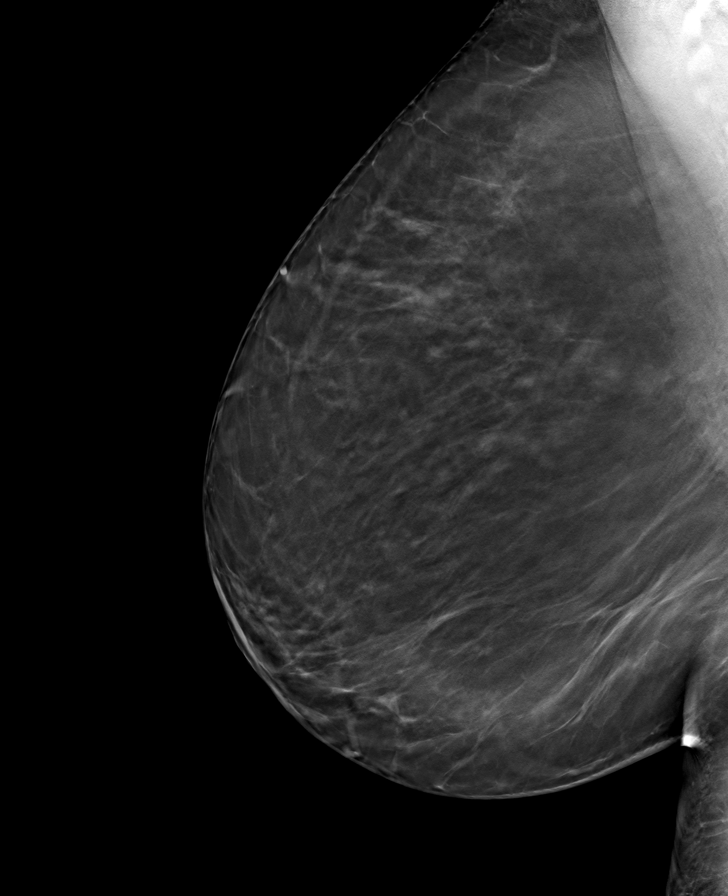

[L MLO tomo · tomo slice 55/108.0]
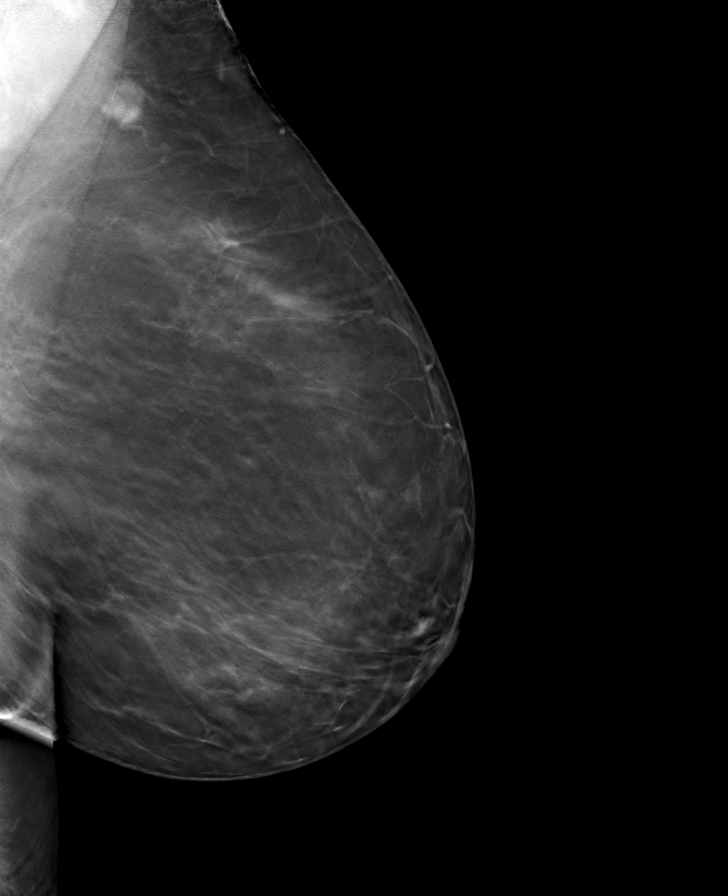

[L CC tomo · tomo slice 51/101.0]
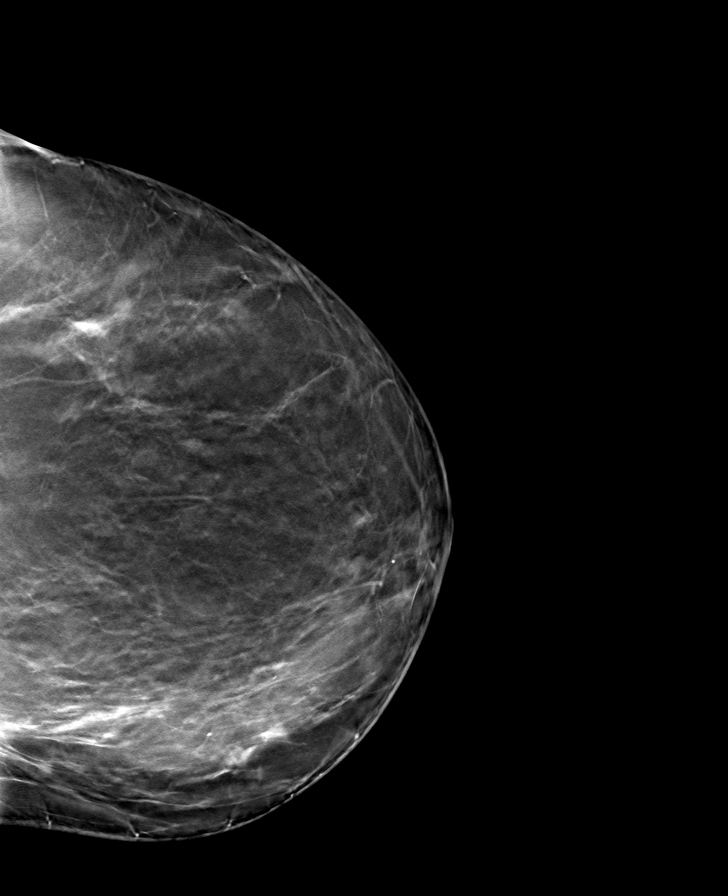

[R CC tomo · tomo slice 50/99.0]
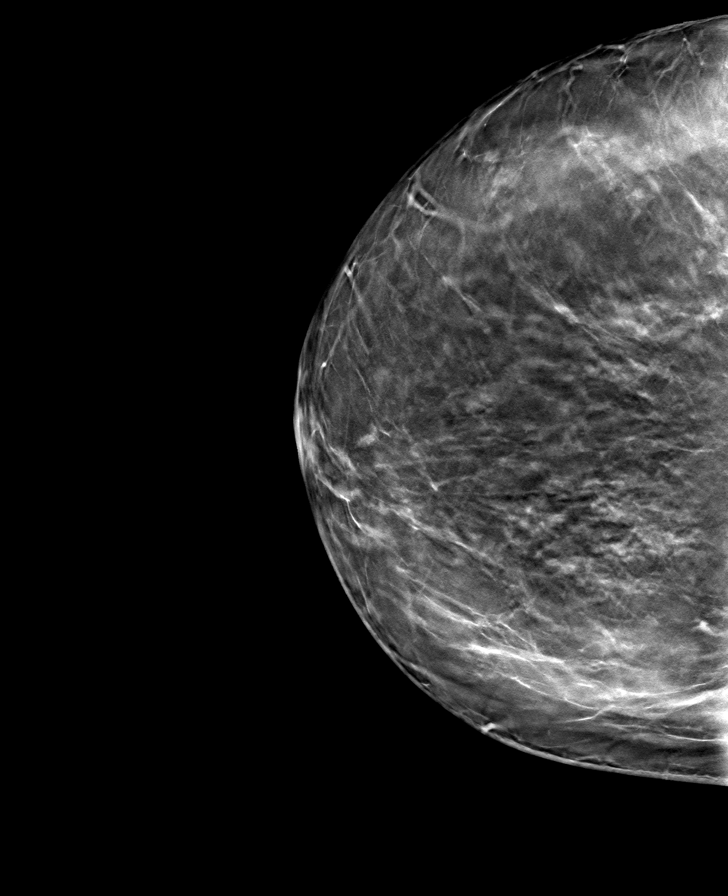

[8 of 24 positions shown; findings below may reference images not displayed]

ACR Breast Density Category b: There are scattered areas of
fibroglandular density.
FINDINGS: There are no findings suspicious for malignancy.
IMPRESSION: No mammographic evidence of malignancy. A result letter of this
screening mammogram will be mailed directly to the patient.

RECOMMENDATION:
Screening mammogram in one year. (Code:51-O-LD2)

BI-RADS CATEGORY  1: Negative.

## 2022-04-23 ENCOUNTER — Telehealth: Payer: Self-pay

## 2022-04-23 NOTE — Telephone Encounter (Signed)
Patient called stating she would like Dr. Marguerita Merles to prescribe her something for menopause. C/o hotflashes and not sleeping.  I called and spoke with patient and recommended OV to come talk with Dr. Marguerita Merles about options, risks/benefits, etc.  She was agreeable and message sent to appt desk to call her to schedule visit.

## 2022-04-24 ENCOUNTER — Ambulatory Visit: Payer: BC Managed Care – PPO | Admitting: Obstetrics & Gynecology

## 2022-05-01 ENCOUNTER — Ambulatory Visit: Payer: BC Managed Care – PPO | Admitting: Obstetrics & Gynecology

## 2022-05-01 ENCOUNTER — Encounter: Payer: Self-pay | Admitting: Obstetrics & Gynecology

## 2022-05-01 VITALS — BP 104/78

## 2022-05-01 DIAGNOSIS — N951 Menopausal and female climacteric states: Secondary | ICD-10-CM | POA: Diagnosis not present

## 2022-05-01 MED ORDER — ESTRADIOL 0.05 MG/24HR TD PTTW: 1.0000 | MEDICATED_PATCH | TRANSDERMAL | 4 refills | Status: DC

## 2022-05-01 NOTE — Progress Notes (Unsigned)
    Lynn Crawford 10/09/1966 [id number]        56 y.o.  [id number] Boyfriend  RP: Severe night sweats/hot flushes   HPI: Well on Mirena IUD.  Severe night sweats preventing her to sleep.  Bothersome hot flushes at work.  Vaginal dryness with IC.   OB History  Gravida Para Term Preterm AB Living  3 1 1   2 1   SAB IAB Ectopic Multiple Live Births    2          # Outcome Date GA Lbr Len/2nd Weight Sex Delivery Anes PTL Lv  3 IAB           2 IAB           1 Term             Past medical history,surgical history, problem list, medications, allergies, family history and social history were all reviewed and documented in the EPIC chart.   Directed ROS with pertinent positives and negatives documented in the history of present illness/assessment and plan.  Exam:  Vitals:   05/01/22 1447  BP: 104/78   General appearance:  Normal   Gynecologic exam: Deferred   Assessment/Plan:  56 y.o. G3P1021   1. Menopausal syndrome   Other orders - estradiol (VIVELLE-DOT) 0.05 MG/24HR patch; Place 1 patch (0.05 mg total) onto the skin 2 (two) times a week.   57 MD, 3:27 PM 05/01/2022

## 2022-05-03 ENCOUNTER — Encounter: Payer: Self-pay | Admitting: Obstetrics & Gynecology

## 2022-05-04 DIAGNOSIS — E782 Mixed hyperlipidemia: Secondary | ICD-10-CM | POA: Diagnosis not present

## 2022-05-04 DIAGNOSIS — E785 Hyperlipidemia, unspecified: Secondary | ICD-10-CM | POA: Diagnosis not present

## 2022-06-05 DIAGNOSIS — R7303 Prediabetes: Secondary | ICD-10-CM | POA: Diagnosis not present

## 2022-07-20 ENCOUNTER — Other Ambulatory Visit: Payer: Self-pay | Admitting: Obstetrics & Gynecology

## 2022-07-20 DIAGNOSIS — Z1231 Encounter for screening mammogram for malignant neoplasm of breast: Secondary | ICD-10-CM

## 2022-07-22 ENCOUNTER — Ambulatory Visit
Admission: RE | Admit: 2022-07-22 | Discharge: 2022-07-22 | Disposition: A | Discharge status: Home / Self Care | Payer: BC Managed Care – PPO | Source: Ambulatory Visit | Attending: Obstetrics & Gynecology | Admitting: Obstetrics & Gynecology

## 2022-07-22 DIAGNOSIS — Z1231 Encounter for screening mammogram for malignant neoplasm of breast: Secondary | ICD-10-CM | POA: Diagnosis not present

## 2022-07-24 ENCOUNTER — Other Ambulatory Visit: Payer: Self-pay | Admitting: Obstetrics & Gynecology

## 2022-07-24 DIAGNOSIS — R928 Other abnormal and inconclusive findings on diagnostic imaging of breast: Secondary | ICD-10-CM

## 2022-07-28 DIAGNOSIS — K648 Other hemorrhoids: Secondary | ICD-10-CM | POA: Diagnosis not present

## 2022-07-28 DIAGNOSIS — K5730 Diverticulosis of large intestine without perforation or abscess without bleeding: Secondary | ICD-10-CM | POA: Diagnosis not present

## 2022-07-28 DIAGNOSIS — Z1211 Encounter for screening for malignant neoplasm of colon: Secondary | ICD-10-CM | POA: Diagnosis not present

## 2022-08-04 ENCOUNTER — Ambulatory Visit
Admission: RE | Admit: 2022-08-04 | Discharge: 2022-08-04 | Disposition: A | Discharge status: Home / Self Care | Payer: BC Managed Care – PPO | Source: Ambulatory Visit | Attending: Obstetrics & Gynecology | Admitting: Obstetrics & Gynecology

## 2022-08-04 ENCOUNTER — Other Ambulatory Visit: Payer: Self-pay | Admitting: Obstetrics & Gynecology

## 2022-08-04 DIAGNOSIS — N6310 Unspecified lump in the right breast, unspecified quadrant: Secondary | ICD-10-CM

## 2022-08-04 DIAGNOSIS — R928 Other abnormal and inconclusive findings on diagnostic imaging of breast: Secondary | ICD-10-CM

## 2022-08-04 DIAGNOSIS — N6311 Unspecified lump in the right breast, upper outer quadrant: Secondary | ICD-10-CM | POA: Diagnosis not present

## 2022-08-06 ENCOUNTER — Ambulatory Visit
Admission: RE | Admit: 2022-08-06 | Discharge: 2022-08-06 | Disposition: A | Discharge status: Home / Self Care | Payer: BC Managed Care – PPO | Source: Ambulatory Visit | Attending: Obstetrics & Gynecology | Admitting: Obstetrics & Gynecology

## 2022-08-06 DIAGNOSIS — N6011 Diffuse cystic mastopathy of right breast: Secondary | ICD-10-CM | POA: Diagnosis not present

## 2022-08-06 DIAGNOSIS — N6311 Unspecified lump in the right breast, upper outer quadrant: Secondary | ICD-10-CM | POA: Diagnosis not present

## 2022-08-06 DIAGNOSIS — N6310 Unspecified lump in the right breast, unspecified quadrant: Secondary | ICD-10-CM

## 2022-08-10 ENCOUNTER — Other Ambulatory Visit: Payer: BC Managed Care – PPO

## 2022-08-14 ENCOUNTER — Other Ambulatory Visit: Payer: Self-pay | Admitting: *Deleted

## 2022-08-14 MED ORDER — VALACYCLOVIR HCL 1 G PO TABS: ORAL_TABLET | ORAL | 0 refills | Status: DC

## 2022-08-14 NOTE — Telephone Encounter (Signed)
Patient called requesting refill on valtrex 1000 mg tablet, last filled in 06/2019.  Last annual exam was 01/2022.

## 2022-08-14 NOTE — Telephone Encounter (Signed)
Rx sent with 0 refills.

## 2022-12-10 DIAGNOSIS — H5213 Myopia, bilateral: Secondary | ICD-10-CM | POA: Diagnosis not present

## 2022-12-10 DIAGNOSIS — H524 Presbyopia: Secondary | ICD-10-CM | POA: Diagnosis not present

## 2023-02-02 ENCOUNTER — Encounter: Payer: Self-pay | Admitting: Obstetrics & Gynecology

## 2023-02-02 ENCOUNTER — Ambulatory Visit (INDEPENDENT_AMBULATORY_CARE_PROVIDER_SITE_OTHER): Payer: BC Managed Care – PPO | Admitting: Obstetrics & Gynecology

## 2023-02-02 VITALS — BP 106/68 | HR 67 | Ht 63.25 in | Wt 180.0 lb

## 2023-02-02 DIAGNOSIS — B3731 Acute candidiasis of vulva and vagina: Secondary | ICD-10-CM | POA: Diagnosis not present

## 2023-02-02 DIAGNOSIS — Z30431 Encounter for routine checking of intrauterine contraceptive device: Secondary | ICD-10-CM

## 2023-02-02 DIAGNOSIS — Z79890 Hormone replacement therapy (postmenopausal): Secondary | ICD-10-CM

## 2023-02-02 DIAGNOSIS — Z01419 Encounter for gynecological examination (general) (routine) without abnormal findings: Secondary | ICD-10-CM | POA: Diagnosis not present

## 2023-02-02 DIAGNOSIS — N898 Other specified noninflammatory disorders of vagina: Secondary | ICD-10-CM | POA: Diagnosis not present

## 2023-02-02 DIAGNOSIS — N950 Postmenopausal bleeding: Secondary | ICD-10-CM | POA: Diagnosis not present

## 2023-02-02 LAB — WET PREP FOR TRICH, YEAST, CLUE

## 2023-02-02 MED ORDER — ESTRADIOL 0.05 MG/24HR TD PTTW: 1.0000 | MEDICATED_PATCH | TRANSDERMAL | 4 refills | Status: DC

## 2023-02-02 MED ORDER — FLUCONAZOLE 150 MG PO TABS: 150.0000 mg | ORAL_TABLET | Freq: Once | ORAL | 0 refills | Status: AC

## 2023-02-02 NOTE — Progress Notes (Signed)
Lynn Crawford [birth year] [id number]   History:    57 y.o.  G3P1A2L1 Divorced. Stable boyfriend x 5 years.  10+ yo grand-child.   RP:  Established patient presenting for annual gyn exam    HPI: Postmenopause on Estradiol patch 0.05 twice a week and Mirena IUD x 04/2019. Light PMB about once a month. Sometimes forgets to change the patch on time.  Sexually active, strict condom use.  No pelvic pain.  Pt c/o vaginal itching, irritation, grey discharge-no odor.  Pap Neg in 01/2022.  Urine/BMs normal. Breasts normal. Mammo Lt Neg 06/2022, Rt Dx mammo/US Neg.  BMI 31.63.  Not exercising regularly.  Health labs with Fam MD. COLONOSCOPY: 2023.   Past medical history,surgical history, family history and social history were all reviewed and documented in the EPIC chart.  Gynecologic History No LMP recorded. (Menstrual status: IUD).  Obstetric History OB History  Gravida Para Term Preterm AB Living  '3 1 1   2 1  '$ SAB IAB Ectopic Multiple Live Births    2          # Outcome Date GA Lbr Len/2nd Weight Sex Delivery Anes PTL Lv  3 IAB           2 IAB           1 Term              ROS: A ROS was performed and pertinent positives and negatives are included in the history. GENERAL: No fevers or chills. HEENT: No change in vision, no earache, sore throat or sinus congestion. NECK: No pain or stiffness. CARDIOVASCULAR: No chest pain or pressure. No palpitations. PULMONARY: No shortness of breath, cough or wheeze. GASTROINTESTINAL: No abdominal pain, nausea, vomiting or diarrhea, melena or bright red blood per rectum. GENITOURINARY: No urinary frequency, urgency, hesitancy or dysuria. MUSCULOSKELETAL: No joint or muscle pain, no back pain, no recent trauma. DERMATOLOGIC: No rash, no itching, no lesions. ENDOCRINE: No polyuria, polydipsia, no heat or cold intolerance. No recent change in weight. HEMATOLOGICAL: No anemia or easy bruising or bleeding. NEUROLOGIC: No headache, seizures, numbness, tingling or  weakness. PSYCHIATRIC: No depression, no loss of interest in normal activity or change in sleep pattern.     Exam:   BP 106/68   Pulse 67   Ht 5' 3.25" (1.607 m)   Wt 180 lb (81.6 kg)   SpO2 97%   BMI 31.63 kg/m   Body mass index is 31.63 kg/m.  General appearance : Well developed well nourished female. No acute distress HEENT: Eyes: no retinal hemorrhage or exudates,  Neck supple, trachea midline, no carotid bruits, no thyroidmegaly Lungs: Clear to auscultation, no rhonchi or wheezes, or rib retractions  Heart: Regular rate and rhythm, no murmurs or gallops Breast:Examined in sitting and supine position were symmetrical in appearance, no palpable masses or tenderness,  no skin retraction, no nipple inversion, no nipple discharge, no skin discoloration, no axillary or supraclavicular lymphadenopathy Abdomen: no palpable masses or tenderness, no rebound or guarding Extremities: no edema or skin discoloration or tenderness  Pelvic: Vulva: Normal             Vagina: No gross lesions.  Thick discharge.  Wet prep done.  Cervix: No gross lesions.  [facility]UD strings visible at [facility].  Uterus  AV, normal size, shape and consistency, non-tender and mobile  Adnexa  Without masses or tenderness  Anus: Normal  Wet Prep: Negative   Assessment/Plan:  57 y.o. female for annual  exam   1. Well female exam with routine gynecological exam Postmenopause on Estradiol patch 0.05 twice a week and Mirena IUD x 04/2019. Light PMB about once a month. Sometimes forgets to change the patch on time.  Sexually active, strict condom use.  No pelvic pain.  Pt c/o vaginal itching, irritation, grey discharge-no odor.  Pap Neg in 01/2022.  Urine/BMs normal. Breasts normal. Mammo Lt Neg 06/2022, Rt Dx mammo/US Neg.  BMI 31.63.  Not exercising regularly.  Health labs with Fam MD. COLONOSCOPY: 2023.   2. Postmenopausal hormone replacement therapy Postmenopause on Estradiol patch 0.05 twice a week and Mirena IUD x 04/2019.    3. Light PMB (postmenopausal bleeding) on HRT Light PMB about once a month. Sometimes forgets to change the patch on time. Will further investigate with a Pelvic US at f/u. - US Transvaginal Non-OB; Future  4. Encounter for routine checking of intrauterine contraceptive device (IUD) Well on Mirena IUD x 04/2019. IUD in good position.  5. Vaginal discharge Wet prep Neg, but given the itching with thick d/c, decision to treat with Fluconazole 150 mg x 1 tab. - WET PREP FOR [place], YEAST, CLUE  Other orders - estradiol (VIVELLE-DOT) 0.05 MG/24HR patch; Place 1 patch (0.05 mg total) onto the skin 2 (two) times a week.  - fluconazole (DIFLUCAN) 150 MG tablet; Take 1 tablet (150 mg total) by mouth once for 1 dose.   Princess Bruins MD, 11:58 AM

## 2023-03-03 ENCOUNTER — Other Ambulatory Visit: Payer: BC Managed Care – PPO

## 2023-03-03 ENCOUNTER — Other Ambulatory Visit: Payer: BC Managed Care – PPO | Admitting: Obstetrics & Gynecology

## 2023-03-06 ENCOUNTER — Ambulatory Visit
Admission: RE | Admit: 2023-03-06 | Discharge: 2023-03-06 | Disposition: A | Discharge status: Home / Self Care | Payer: BC Managed Care – PPO | Source: Ambulatory Visit | Attending: Emergency Medicine | Admitting: Emergency Medicine

## 2023-03-06 VITALS — BP 130/87 | HR 86 | Temp 98.2°F | Resp 17

## 2023-03-06 DIAGNOSIS — M109 Gout, unspecified: Secondary | ICD-10-CM | POA: Diagnosis not present

## 2023-03-06 MED ORDER — PREDNISONE 10 MG (21) PO TBPK: ORAL_TABLET | Freq: Every day | ORAL | 0 refills | Status: AC

## 2023-03-06 NOTE — Discharge Instructions (Addendum)
Prescribed prednisone Follow up with PCP for further evaluation and management Return or go to the ED if you have any new or worsening symptoms

## 2023-03-06 NOTE — ED Triage Notes (Signed)
Patient presents with left foot pain that started 3 days ago.

## 2023-03-06 NOTE — ED Provider Notes (Signed)
[facility]   [id number] 03/06/23 Arrival Time: 1157   Chief Complaint  Patient presents with   Foot Pain    Entered by patient     SUBJECTIVE: History from: patient.  Lynn Crawford is a 57 y.o. female  presented to the urgent care with a complaint of left big toe pain that started 3 days ago.  Denies any precipitating factors or known injury.  Has tried OTC medication with mild relief.  Symptoms are made worse with ROM.  Denies similar symptoms in the past.  Denies chills, fever, nausea, vomiting, diarrhea..   ROS: As per HPI.  All other pertinent ROS negative.      Past Medical History:  Diagnosis Date   Elevated cholesterol    HSV infection    vaginal   Hypertension    Past Surgical History:  Procedure Laterality Date   CERVICAL BIOPSY  W/ LOOP ELECTRODE EXCISION     x2   INTRAUTERINE DEVICE INSERTION     mirena iud inserted 04-25-2019   Allergies  Allergen Reactions   Amlodipine Besylate     Other reaction(s): 10 mg dose leads to lower extremity edema; tolerates 5 mg dose well   No current facility-administered medications on file prior to encounter.   Current Outpatient Medications on File Prior to Encounter  Medication Sig Dispense Refill   amLODipine (NORVASC) 10 MG tablet Take 5 mg by mouth daily.     atorvastatin (LIPITOR) 10 MG tablet Take 10 mg by mouth daily.     estradiol (VIVELLE-DOT) 0.05 MG/24HR patch Place 1 patch (0.05 mg total) onto the skin 2 (two) times a week. 24 patch 4   hydrochlorothiazide (HYDRODIURIL) 12.5 MG tablet Take 12.5 mg by mouth daily.     ibuprofen (ADVIL) 600 MG tablet Take 600 mg by mouth every 6 (six) hours as needed.     metoprolol succinate (TOPROL-XL) 25 MG 24 hr tablet TK 1 T PO QD     pantoprazole (PROTONIX) 40 MG tablet Take 40 mg by mouth daily.     valACYclovir (VALTREX) 1000 MG tablet TAKE 1/2 TABLET(500 MG) BY MOUTH DAILY 45 tablet 0   Social History   Socioeconomic History   Marital status:  Divorced    Spouse name: Not on file   Number of children: Not on file   Years of education: Not on file   Highest education level: Not on file  Occupational History   Not on file  Tobacco Use   Smoking status: Never   Smokeless tobacco: Never  Vaping Use   Vaping Use: Never used  Substance and Sexual Activity   Alcohol use: Yes    Comment: glass of wine with dinner   Drug use: Never   Sexual activity: Yes    Partners: Male    Birth control/protection: I.U.D.    Comment: 1st intercourse- 89, mirena  Other Topics Concern   Not on file  Social History Narrative   Not on file   Social Determinants of Health   Financial Resource Strain: Not on file  Food Insecurity: Not on file  Transportation Needs: Not on file  Physical Activity: Not on file  Stress: Not on file  Social Connections: Not on file  Intimate Partner Violence: Not on file   Family History  Problem Relation Age of Onset   Diabetes Mother    Hypertension Mother    Hypertension Father    Hypertension Sister    Cancer Brother  colon   Hypertension Brother    Breast cancer Other     OBJECTIVE:  Vitals:   03/06/23 1213  BP: 130/87  Pulse: 86  Resp: 17  Temp: 98.2 F (36.8 C)  SpO2: 98%     Physical Exam Vitals and nursing note reviewed.  Constitutional:      General: She is not in acute distress.    Appearance: Normal appearance. She is normal weight. She is not ill-appearing, toxic-appearing or diaphoretic.  HENT:     Head: Normocephalic.  Cardiovascular:     Rate and Rhythm: Normal rate and regular rhythm.     Pulses: Normal pulses.     Heart sounds: Normal heart sounds. No murmur heard.    No friction rub. No gallop.  Pulmonary:     Effort: Pulmonary effort is normal. No respiratory distress.     Breath sounds: Normal breath sounds. No stridor. No wheezing, rhonchi or rales.  Chest:     Chest wall: No tenderness.  Musculoskeletal:     Right foot: Normal.     Left foot:  Swelling present.     Comments: There is swelling and warmth present to the left big toe as compared to the right.  Neurovascular status is intact.  Range of motion with pain.  Neurological:     Mental Status: She is alert and oriented to person, place, and time.      LABS:  No results found for this or any previous visit (from the past 24 hour(s)).   ASSESSMENT & PLAN:  1. Acute gout involving toe of left foot, unspecified cause     Meds ordered this encounter  Medications   predniSONE (STERAPRED UNI-PAK 21 TAB) 10 MG (21) TBPK tablet    Sig: Take by mouth daily. Take 6 tabs by mouth daily  for 1 days, then 5 tabs for 1 days, then 4 tabs for 1 days, then 3 tabs for 1 days, 2 tabs for 1 days, then 1 tab by mouth daily for 1 days    Dispense:  21 tablet    Refill:  0   Discharge instructions   Prescribed prednisone Follow up with PCP for further evaluation and management Return or go to the ED if you have any new or worsening symptoms   Reviewed expectations re: course of current medical issues. Questions answered. Outlined signs and symptoms indicating need for more acute intervention. Patient verbalized understanding. After Visit Summary given.          Durward Parcel, FNP 03/06/23 1243

## 2023-05-19 ENCOUNTER — Other Ambulatory Visit: Payer: Self-pay | Admitting: Obstetrics & Gynecology

## 2023-05-19 NOTE — Telephone Encounter (Signed)
Med refill request: Valtrex Last AEX: 02/02/23 Next AEX: not scheduled Last MMG (if hormonal med) 07/22/22 Refill authorized: Please Advise, #[phone], 0 RF

## 2023-09-28 ENCOUNTER — Other Ambulatory Visit: Payer: Self-pay | Admitting: Obstetrics and Gynecology

## 2023-09-28 DIAGNOSIS — Z1231 Encounter for screening mammogram for malignant neoplasm of breast: Secondary | ICD-10-CM

## 2023-09-29 ENCOUNTER — Inpatient Hospital Stay
Admission: RE | Admit: 2023-09-29 | Discharge: 2023-09-29 | Discharge status: Home / Self Care | Payer: BC Managed Care – PPO | Source: Ambulatory Visit | Attending: Obstetrics and Gynecology

## 2023-09-29 DIAGNOSIS — Z1231 Encounter for screening mammogram for malignant neoplasm of breast: Secondary | ICD-10-CM | POA: Diagnosis not present

## 2023-11-25 DIAGNOSIS — M10072 Idiopathic gout, left ankle and foot: Secondary | ICD-10-CM | POA: Diagnosis not present

## 2023-11-25 DIAGNOSIS — Z1339 Encounter for screening examination for other mental health and behavioral disorders: Secondary | ICD-10-CM | POA: Diagnosis not present

## 2023-11-25 DIAGNOSIS — Z1331 Encounter for screening for depression: Secondary | ICD-10-CM | POA: Diagnosis not present

## 2023-12-02 DIAGNOSIS — R634 Abnormal weight loss: Secondary | ICD-10-CM | POA: Diagnosis not present

## 2023-12-02 DIAGNOSIS — M10071 Idiopathic gout, right ankle and foot: Secondary | ICD-10-CM | POA: Diagnosis not present

## 2023-12-06 DIAGNOSIS — R634 Abnormal weight loss: Secondary | ICD-10-CM | POA: Diagnosis not present

## 2023-12-06 DIAGNOSIS — M79674 Pain in right toe(s): Secondary | ICD-10-CM | POA: Diagnosis not present

## 2023-12-06 DIAGNOSIS — E7800 Pure hypercholesterolemia, unspecified: Secondary | ICD-10-CM | POA: Diagnosis not present

## 2023-12-06 DIAGNOSIS — I10 Essential (primary) hypertension: Secondary | ICD-10-CM | POA: Diagnosis not present

## 2024-01-10 DIAGNOSIS — I10 Essential (primary) hypertension: Secondary | ICD-10-CM | POA: Diagnosis not present

## 2024-01-10 DIAGNOSIS — R946 Abnormal results of thyroid function studies: Secondary | ICD-10-CM | POA: Diagnosis not present

## 2024-01-10 DIAGNOSIS — E782 Mixed hyperlipidemia: Secondary | ICD-10-CM | POA: Diagnosis not present

## 2024-01-10 DIAGNOSIS — E790 Hyperuricemia without signs of inflammatory arthritis and tophaceous disease: Secondary | ICD-10-CM | POA: Diagnosis not present

## 2024-01-17 ENCOUNTER — Encounter: Payer: Self-pay | Admitting: Neurology

## 2024-01-17 DIAGNOSIS — R202 Paresthesia of skin: Secondary | ICD-10-CM | POA: Diagnosis not present

## 2024-01-17 DIAGNOSIS — M109 Gout, unspecified: Secondary | ICD-10-CM | POA: Diagnosis not present

## 2024-01-17 DIAGNOSIS — M79672 Pain in left foot: Secondary | ICD-10-CM | POA: Diagnosis not present

## 2024-01-20 ENCOUNTER — Ambulatory Visit: Payer: BC Managed Care – PPO | Admitting: Podiatry

## 2024-01-20 ENCOUNTER — Encounter: Payer: Self-pay | Admitting: Podiatry

## 2024-01-20 ENCOUNTER — Ambulatory Visit (INDEPENDENT_AMBULATORY_CARE_PROVIDER_SITE_OTHER): Payer: BC Managed Care – PPO

## 2024-01-20 DIAGNOSIS — M19072 Primary osteoarthritis, left ankle and foot: Secondary | ICD-10-CM

## 2024-01-20 DIAGNOSIS — M19071 Primary osteoarthritis, right ankle and foot: Secondary | ICD-10-CM | POA: Diagnosis not present

## 2024-01-20 DIAGNOSIS — M722 Plantar fascial fibromatosis: Secondary | ICD-10-CM | POA: Diagnosis not present

## 2024-01-20 DIAGNOSIS — M778 Other enthesopathies, not elsewhere classified: Secondary | ICD-10-CM

## 2024-01-20 NOTE — Progress Notes (Signed)
 Subjective:  Patient ID: Lynn Crawford, female    DOB: 10-21-1966,  MRN: [id number] HPI Chief Complaint  Patient presents with   Foot Pain    Dorsal midfoot left - aching severely yesterday, woke up in pain, night before noticed foot was throbbing, history of gout, couldn't barely put weight on it, on prednisone from gout flare of 1st MPJ right foot (ongoing 1 month now)   New Patient (Initial Visit)    58 y.o. female presents with the above complaint.   ROS: Denies fever chills nausea vomiting muscle aches pains calf pain back pain chest pain shortness of breath.  She states that when she was put on amlodipine 10 mg she started swelling.  She was reduced to amlodipine 5 mg and the swelling went down.  States that subsequently she was put on hydrochlorothiazide for the swelling as well.  She then goes on to say that after the hydrochlorothiazide she developed gout.  Her primary care doctor has remove the hydrochlorothiazide at this point replacing it with Micardis.  She remains on 5 mg of amlodipine she states.  She is currently taking prednisone.  She is also been prescribed allopurinol which she has not started taking as of yet.  Past Medical History:  Diagnosis Date   Elevated cholesterol    HSV infection    vaginal   Hypertension    Past Surgical History:  Procedure Laterality Date   CERVICAL BIOPSY  W/ LOOP ELECTRODE EXCISION     x2   INTRAUTERINE DEVICE INSERTION     mirena iud inserted 04-25-2019    Current Outpatient Medications:    telmisartan (MICARDIS) 20 MG tablet, 1 tablet Orally Once a day for 30 days, Disp: , Rfl:    amLODipine (NORVASC) 10 MG tablet, Take 5 mg by mouth daily., Disp: , Rfl:    atorvastatin (LIPITOR) 10 MG tablet, Take 10 mg by mouth daily., Disp: , Rfl:    colchicine 0.6 MG tablet, 1 tablet Orally as needed for gout flare ups, Disp: , Rfl:    estradiol (VIVELLE-DOT) 0.05 MG/24HR patch, Place 1 patch (0.05 mg total) onto the skin 2 (two) times a  week., Disp: 24 patch, Rfl: 4   hydrochlorothiazide (HYDRODIURIL) 12.5 MG tablet, Take 12.5 mg by mouth daily., Disp: , Rfl:    ibuprofen (ADVIL) 600 MG tablet, Take 600 mg by mouth every 6 (six) hours as needed., Disp: , Rfl:    metoprolol succinate (TOPROL-XL) 25 MG 24 hr tablet, TK 1 T PO QD, Disp: , Rfl:    pantoprazole (PROTONIX) 40 MG tablet, Take 40 mg by mouth daily., Disp: , Rfl:    predniSONE (STERAPRED UNI-PAK 21 TAB) 10 MG (21) TBPK tablet, Take by mouth daily. Take 6 tabs by mouth daily  for 1 days, then 5 tabs for 1 days, then 4 tabs for 1 days, then 3 tabs for 1 days, 2 tabs for 1 days, then 1 tab by mouth daily for 1 days, Disp: 21 tablet, Rfl: 0   valACYclovir (VALTREX) 1000 MG tablet, TAKE 1/2 TABLET(500 MG) BY MOUTH DAILY, Disp: 45 tablet, Rfl: 0  Allergies  Allergen Reactions   Amlodipine Besylate     Other reaction(s): 10 mg dose leads to lower extremity edema; tolerates 5 mg dose well   Review of Systems Objective:  There were no vitals filed for this visit.  General: Well developed, nourished, in no acute distress, alert and oriented x3   Dermatological: Skin is warm, dry and supple  bilateral. Nails x 10 are well maintained; remaining integument appears unremarkable at this time. There are no open sores, no preulcerative lesions, no rash or signs of infection present.  Vascular: Dorsalis Pedis artery and Posterior Tibial artery pedal pulses are 2/4 bilateral with immedate capillary fill time. Pedal hair growth present. No varicosities and no lower extremity edema present bilateral.   Neruologic: Grossly intact via light touch bilateral. Vibratory intact via tuning fork bilateral. Protective threshold with Semmes Wienstein monofilament intact to all pedal sites bilateral. Patellar and Achilles deep tendon reflexes 2+ bilateral. No Babinski or clonus noted bilateral.   Musculoskeletal: No gross boney pedal deformities bilateral. No pain, crepitus, or limitation noted  with foot and ankle range of motion bilateral. Muscular strength 5/5 in all groups tested bilateral.  She has soft tissue swelling plantar medial longitudinal arch appears to be fibromatous in nature it is firm but not hard like a regular fibroma.  It is on the medial longitudinal arch most likely medial to the plantar fascia.  It is not pulsatile.  Gait: Unassisted, Nonantalgic.    Radiographs:  Soft tissue mass visible on radiograph as an increase in density but no calcification and no fractures no acute findings to either foot.  Assessment & Plan:   Assessment: History of gout.  Plantar fibroma with pes planus right foot pes planus left foot capsulitis dorsal aspect of the left foot and first foot most likely associated with gout  Plan: Discussed etiology pathology conservative versus surgical therapies.  Explained to her that should she develop gout again she should come to the office get blood work.  This patient is to be worked in immediately to be seen should she call with symptoms of gout.  I recommended that she do is her doctor had suggested remaining off of the hydrochlorothiazide starting the allopurinol.  I will follow-up with her at her request.  Should the soft tissue mass become painful over time or more firm she is to notify us and an MRI will be performed.     Aliyanna Wassmer T. [place], [place]

## 2024-02-01 ENCOUNTER — Ambulatory Visit: Payer: BC Managed Care – PPO | Admitting: Neurology

## 2024-02-07 ENCOUNTER — Ambulatory Visit: Payer: BC Managed Care – PPO | Admitting: Obstetrics and Gynecology

## 2024-02-08 ENCOUNTER — Ambulatory Visit (INDEPENDENT_AMBULATORY_CARE_PROVIDER_SITE_OTHER): Payer: BC Managed Care – PPO | Admitting: Obstetrics and Gynecology

## 2024-02-08 ENCOUNTER — Encounter: Payer: Self-pay | Admitting: Obstetrics and Gynecology

## 2024-02-08 VITALS — BP 134/78 | HR 81 | Temp 98.3°F | Ht 64.25 in | Wt 168.0 lb

## 2024-02-08 DIAGNOSIS — E7800 Pure hypercholesterolemia, unspecified: Secondary | ICD-10-CM | POA: Insufficient documentation

## 2024-02-08 DIAGNOSIS — N898 Other specified noninflammatory disorders of vagina: Secondary | ICD-10-CM

## 2024-02-08 DIAGNOSIS — A6004 Herpesviral vulvovaginitis: Secondary | ICD-10-CM

## 2024-02-08 DIAGNOSIS — N760 Acute vaginitis: Secondary | ICD-10-CM | POA: Diagnosis not present

## 2024-02-08 DIAGNOSIS — Z975 Presence of (intrauterine) contraceptive device: Secondary | ICD-10-CM

## 2024-02-08 DIAGNOSIS — Z01419 Encounter for gynecological examination (general) (routine) without abnormal findings: Secondary | ICD-10-CM

## 2024-02-08 DIAGNOSIS — Z79890 Hormone replacement therapy (postmenopausal): Secondary | ICD-10-CM | POA: Diagnosis not present

## 2024-02-08 DIAGNOSIS — B9689 Other specified bacterial agents as the cause of diseases classified elsewhere: Secondary | ICD-10-CM

## 2024-02-08 DIAGNOSIS — Z1331 Encounter for screening for depression: Secondary | ICD-10-CM

## 2024-02-08 DIAGNOSIS — F5101 Primary insomnia: Secondary | ICD-10-CM | POA: Diagnosis not present

## 2024-02-08 DIAGNOSIS — M109 Gout, unspecified: Secondary | ICD-10-CM | POA: Insufficient documentation

## 2024-02-08 LAB — WET PREP FOR TRICH, YEAST, CLUE

## 2024-02-08 MED ORDER — METRONIDAZOLE 500 MG PO TABS: 500.0000 mg | ORAL_TABLET | Freq: Two times a day (BID) | ORAL | 0 refills | Status: AC

## 2024-02-08 MED ORDER — VALACYCLOVIR HCL 500 MG PO TABS: 500.0000 mg | ORAL_TABLET | Freq: Two times a day (BID) | ORAL | 3 refills | Status: AC

## 2024-02-08 MED ORDER — ESTRADIOL 0.075 MG/24HR TD PTTW: 1.0000 | MEDICATED_PATCH | TRANSDERMAL | 3 refills | Status: AC

## 2024-02-08 NOTE — Assessment & Plan Note (Signed)
 Reviewed sleep hygiene, including cooler ambient temperatures and regular physical activity. Patient can consider using melatonin and magnesium to help with sleep.

## 2024-02-08 NOTE — Assessment & Plan Note (Signed)

## 2024-02-08 NOTE — Progress Notes (Signed)
 58 y.o. [id number] female on HRT (estradiol patch+ Mirena IUD, inserted June 2022), history of CIN-3 (2009, negative margins) here for annual exam. Divorced, long term relationship.  No LMP recorded. (Menstrual status: IUD).   She reports a vaginal odor and discharge. Still having hot flashes, sometimes wears 2 patches.  Abnormal bleeding: none Pelvic discharge or pain: none Breast mass, nipple discharge or skin changes : none Birth control: Mirena Last PAP:     Component Value Date/Time   DIAGPAP  01/29/2022 1048    - Negative for intraepithelial lesion or malignancy (NILM)   HPVHIGH Negative 01/29/2022 1048   ADEQPAP  01/29/2022 1048    Satisfactory for evaluation; transformation zone component ABSENT.   Last mammogram: 09/29/2023 Last colonoscopy: 2023 Sexually active: No Exercising: Occasionally Smoker: No PHQ-9: 10, difficulty sleeping, notes issue for years where she wakes at 3am  GYN HISTORY: Prior LEEP x2, 2006, 2009 (CIN3, meg margins)  OB History  Gravida Para Term Preterm AB Living  3 1 1  2 1   SAB IAB Ectopic Multiple Live Births   2       # Outcome Date GA Lbr Len/2nd Weight Sex Type Anes PTL Lv  3 IAB           2 IAB           1 Term             Past Medical History:  Diagnosis Date   Elevated cholesterol    Gout    HSV infection    vaginal   Hypertension     Past Surgical History:  Procedure Laterality Date   CERVICAL BIOPSY  W/ LOOP ELECTRODE EXCISION     x2   INTRAUTERINE DEVICE INSERTION     mirena iud inserted 04-25-2019    Current Outpatient Medications on File Prior to Visit  Medication Sig Dispense Refill   allopurinol (ZYLOPRIM) 100 MG tablet Take 100 mg by mouth daily.     amLODipine (NORVASC) 10 MG tablet Take 5 mg by mouth daily.     atorvastatin (LIPITOR) 10 MG tablet Take 10 mg by mouth daily.     colchicine 0.6 MG tablet 1 tablet Orally as needed for gout flare ups     ibuprofen (ADVIL) 600 MG tablet Take 600 mg by mouth  every 6 (six) hours as needed.     levonorgestrel (MIRENA) 20 MCG/DAY IUD 1 each by Intrauterine route once.     metoprolol succinate (TOPROL-XL) 25 MG 24 hr tablet TK 1 T PO QD     pantoprazole (PROTONIX) 40 MG tablet Take 40 mg by mouth daily.     predniSONE (STERAPRED UNI-PAK 21 TAB) 10 MG (21) TBPK tablet Take by mouth daily. Take 6 tabs by mouth daily  for 1 days, then 5 tabs for 1 days, then 4 tabs for 1 days, then 3 tabs for 1 days, 2 tabs for 1 days, then 1 tab by mouth daily for 1 days 21 tablet 0   No current facility-administered medications on file prior to visit.    Social History   Socioeconomic History   Marital status: Divorced    Spouse name: Not on file   Number of children: Not on file   Years of education: Not on file   Highest education level: Not on file  Occupational History   Not on file  Tobacco Use   Smoking status: Never   Smokeless tobacco: Never  Vaping Use   Vaping  status: Never Used  Substance and Sexual Activity   Alcohol use: Yes    Comment: glass of wine with dinner   Drug use: Never   Sexual activity: Yes    Partners: Male    Birth control/protection: I.U.D.    Comment: 1st intercourse- 58, mirena  Other Topics Concern   Not on file  Social History Narrative   Not on file   Social Drivers of Health   Financial Resource Strain: Not on file  Food Insecurity: Not on file  Transportation Needs: Not on file  Physical Activity: Not on file  Stress: Not on file  Social Connections: Not on file  Intimate Partner Violence: Not on file    Family History  Problem Relation Age of Onset   Diabetes Mother    Hypertension Mother    Hypertension Father    Hypertension Sister    Cancer Brother        colon   Hypertension Brother    Breast cancer Other     Allergies  Allergen Reactions   Amlodipine Besylate     Other reaction(s): 10 mg dose leads to lower extremity edema; tolerates 5 mg dose well      PE Today's Vitals   02/08/24  0833  BP: 134/78  Pulse: 81  Temp: 98.3 F (36.8 C)  TempSrc: Oral  SpO2: 98%  Weight: 168 lb (76.2 kg)  Height: 5' 4.25" (1.632 m)   Body mass index is 28.61 kg/m.  Physical Exam Vitals reviewed. Exam conducted with a chaperone present.  Constitutional:      General: She is not in acute distress.    Appearance: Normal appearance.  HENT:     Head: Normocephalic and atraumatic.     Nose: Nose normal.  Eyes:     Extraocular Movements: Extraocular movements intact.     Conjunctiva/sclera: Conjunctivae normal.  Neck:     Thyroid: No thyroid mass, thyromegaly or thyroid tenderness.  Pulmonary:     Effort: Pulmonary effort is normal.  Chest:     Chest wall: No mass or tenderness.  Breasts:    Right: Normal. No swelling, mass, nipple discharge, skin change or tenderness.     Left: Normal. No swelling, mass, nipple discharge, skin change or tenderness.  Abdominal:     General: There is no distension.     Palpations: Abdomen is soft.     Tenderness: There is no abdominal tenderness.  Genitourinary:    General: Normal vulva.     Exam position: Lithotomy position.     Urethra: No prolapse.     Vagina: Normal. No vaginal discharge or bleeding.     Cervix: Normal. No cervical motion tenderness, discharge or lesion.     Uterus: Normal. Not enlarged and not tender.      Adnexa: Right adnexa normal and left adnexa normal.     Comments: IUD strings present. Musculoskeletal:        General: Normal range of motion.     Cervical back: Normal range of motion.  Lymphadenopathy:     Upper Body:     Right upper body: No axillary adenopathy.     Left upper body: No axillary adenopathy.     Lower Body: No right inguinal adenopathy. No left inguinal adenopathy.  Skin:    General: Skin is warm and dry.  Neurological:     General: No focal deficit present.     Mental Status: She is alert.  Psychiatric:  Mood and Affect: Mood normal.        Behavior: Behavior normal.        Assessment and Plan:        Well woman exam with routine gynecological exam Assessment & Plan: Cervical cancer screening performed according to ASCCP guidelines. Encouraged annual mammogram screening Colonoscopy UTD DXA N/A Labs and immunizations with her primary Encouraged safe sexual practices as indicated Encouraged healthy lifestyle practices with diet and exercise For patients under 50-70yo, I recommend 1200mg  calcium daily and 600IU of vitamin D daily.    Vaginal discharge -     WET PREP FOR TRICH, YEAST, CLUE  Uses hormone releasing intrauterine device (IUD) for contraception Assessment & Plan: Continue for 26yr, due for removal 2028   Postmenopausal hormone replacement therapy Assessment & Plan: Will increase estradiol patch dosing given hot flashes, sleep disturbances, and mood symptoms  Orders: -     Estradiol; Place 1 patch onto the skin 2 (two) times a week.  Dispense: 24 patch; Refill: 3  Primary insomnia Assessment & Plan: Reviewed sleep hygiene, including cooler ambient temperatures and regular physical activity. Patient can consider using melatonin and magnesium to help with sleep.    BV (bacterial vaginosis) -     metroNIDAZOLE; Take 1 tablet (500 mg total) by mouth 2 (two) times daily for 7 days.  Dispense: 14 tablet; Refill: 0  Herpes simplex vulvovaginitis -     valACYclovir HCl; Take 1 tablet (500 mg total) by mouth 2 (two) times daily for 3 days. With outbreaks  Dispense: 30 tablet; Refill: 3    Rosalyn Gess, MD

## 2024-02-08 NOTE — Assessment & Plan Note (Signed)
 Will increase estradiol patch dosing given hot flashes, sleep disturbances, and mood symptoms

## 2024-02-08 NOTE — Assessment & Plan Note (Signed)
 Continue for 43yr, due for removal 2028

## 2024-02-08 NOTE — Patient Instructions (Signed)

## 2024-02-14 DIAGNOSIS — R202 Paresthesia of skin: Secondary | ICD-10-CM | POA: Diagnosis not present

## 2024-02-14 DIAGNOSIS — I10 Essential (primary) hypertension: Secondary | ICD-10-CM | POA: Diagnosis not present

## 2024-02-14 DIAGNOSIS — M79672 Pain in left foot: Secondary | ICD-10-CM | POA: Diagnosis not present

## 2024-02-14 DIAGNOSIS — M109 Gout, unspecified: Secondary | ICD-10-CM | POA: Diagnosis not present

## 2024-02-29 ENCOUNTER — Encounter: Payer: Self-pay | Admitting: Neurology

## 2024-02-29 ENCOUNTER — Ambulatory Visit: Admitting: Neurology

## 2024-02-29 VITALS — BP 146/89 | HR 78 | Ht 63.0 in | Wt 175.0 lb

## 2024-02-29 DIAGNOSIS — M79605 Pain in left leg: Secondary | ICD-10-CM

## 2024-02-29 DIAGNOSIS — R292 Abnormal reflex: Secondary | ICD-10-CM | POA: Diagnosis not present

## 2024-02-29 DIAGNOSIS — M79604 Pain in right leg: Secondary | ICD-10-CM | POA: Diagnosis not present

## 2024-02-29 MED ORDER — CYCLOBENZAPRINE HCL 5 MG PO TABS: 5.0000 mg | ORAL_TABLET | Freq: Every evening | ORAL | 3 refills | Status: AC | PRN

## 2024-02-29 NOTE — Patient Instructions (Signed)
 MRI lumbar spine without contrast  Start flexeril 5mg  at bedtime as needed

## 2024-02-29 NOTE — Progress Notes (Signed)
 [facility] Clinic Note - Initial Visit   Date: 02/29/2024   Lynn Crawford MRN: [id number] DOB: January 14, 1966   Dear Dr. Azucena Cecil:  Thank you for your kind referral of Lynn Crawford for consultation of bilateral leg pain. Although her history is well known to you, please allow [place] to reiterate it for the purpose of our medical record. The patient was accompanied to the clinic by self.    Lynn Crawford is a 58 y.o. right-handed female with hyperlipidemia, hypertension, GERD, and gout presenting for evaluation of bilateral leg pain.   IMPRESSION/PLAN: Bilateral proximal leg pain.  Exam shows brisk patella reflexes with normal strength and sensation.  I will check MRI lumbar spine wo contrast to evaluate for structural pathology.  Additional imaging of the spine may be indicated going forward. For leg pain, prescription for flexeril 5mg  at bedtime was provided.   Further recommendations pending results.   ------------------------------------------------------------- History of present illness: For the past 2-3 years, she has been having squeezing sensation involving the posterior and lateral side of the thighs on both sides.  It lasts several hours.  It occurs daily.  It is triggered by walking and laughing.  She denies numbness, tingling, or weakness.  No falls.   She works in Equities trader at [employer]  Nonsmoker.  She drinks 4-5 drinks/day on the weekends.    Past Medical History:  Diagnosis Date   Elevated cholesterol    Gout    HSV infection    vaginal   Hypertension     Past Surgical History:  Procedure Laterality Date   CERVICAL BIOPSY  W/ LOOP ELECTRODE EXCISION     x2   INTRAUTERINE DEVICE INSERTION     mirena iud inserted 04-25-2019     Medications:  Outpatient Encounter Medications as of 02/29/2024  Medication Sig   allopurinol (ZYLOPRIM) 100 MG tablet Take 100 mg by mouth daily.   amLODipine (NORVASC) 10 MG  tablet Take 5 mg by mouth daily.   atorvastatin (LIPITOR) 10 MG tablet Take 10 mg by mouth daily.   colchicine 0.6 MG tablet 1 tablet Orally as needed for gout flare ups   estradiol (VIVELLE-DOT) 0.075 MG/24HR Place 1 patch onto the skin 2 (two) times a week.   ibuprofen (ADVIL) 600 MG tablet Take 600 mg by mouth every 6 (six) hours as needed.   levonorgestrel (MIRENA) 20 MCG/DAY IUD 1 each by Intrauterine route once.   metoprolol succinate (TOPROL-XL) 25 MG 24 hr tablet TK 1 T PO QD   pantoprazole (PROTONIX) 40 MG tablet Take 40 mg by mouth daily.   predniSONE (STERAPRED UNI-PAK 21 TAB) 10 MG (21) TBPK tablet Take by mouth daily. Take 6 tabs by mouth daily  for 1 days, then 5 tabs for 1 days, then 4 tabs for 1 days, then 3 tabs for 1 days, 2 tabs for 1 days, then 1 tab by mouth daily for 1 days   No facility-administered encounter medications on file as of 02/29/2024.    Allergies:  Allergies  Allergen Reactions   Amlodipine Besylate     Other reaction(s): 10 mg dose leads to lower extremity edema; tolerates 5 mg dose well    Family History: Family History  Problem Relation Age of Onset   Diabetes Mother    Hypertension Mother    Hypertension Father    Hypertension Sister    Cancer Brother        colon   Hypertension Brother  Breast cancer Other     Social History: Social History   Tobacco Use   Smoking status: Never   Smokeless tobacco: Never  Vaping Use   Vaping status: Never Used  Substance Use Topics   Alcohol use: Yes    Comment: glass of wine with dinner   Drug use: Never   Social History   Social History Narrative   Are you right handed or left handed? Right Handed    Are you currently employed ? Yes   What is your current occupation?   Do you live at home alone? Yes   Who lives with you?    What type of home do you live in: 1 story or 2 story? Lives in a one story home with a basement        Vital Signs:  BP (!) 146/89   Pulse 78   Ht 5\' 3"  (1.6  m)   Wt 175 lb (79.4 kg)   SpO2 100%   BMI 31.00 kg/m    Neurological Exam: MENTAL STATUS including orientation to time, place, person, recent and remote memory, attention span and concentration, language, and fund of knowledge is normal.  Speech is not dysarthric.  CRANIAL NERVES: II:  No visual field defects.     III-IV-VI: Pupils equal round and reactive to light.  Normal conjugate, extra-ocular eye movements in all directions of gaze.  No nystagmus.  No ptosis.   V:  Normal facial sensation.    VII:  Normal facial symmetry and movements.   VIII:  Normal hearing and vestibular function.   IX-X:  Normal palatal movement.   XI:  Normal shoulder shrug and head rotation.   XII:  Normal tongue strength and range of motion, no deviation or fasciculation.  MOTOR:  No atrophy, fasciculations or abnormal movements.  No pronator drift.   Upper Extremity:  Right  Left  Deltoid  5/5   5/5   Biceps  5/5   5/5   Triceps  5/5   5/5   Wrist extensors  5/5   5/5   Wrist flexors  5/5   5/5   Finger extensors  5/5   5/5   Finger flexors  5/5   5/5   Dorsal interossei  5/5   5/5   Abductor pollicis  5/5   5/5   Tone (Ashworth scale)  0  0   Lower Extremity:  Right  Left  Hip flexors  5/5   5/5   Hip extensors  5/5   5/5   Knee flexors  5/5   5/5   Knee extensors  5/5   5/5   Dorsiflexors  5/5   5/5   Plantarflexors  5/5   5/5   Toe extensors  5/5   5/5   Toe flexors  5/5   5/5   Tone (Ashworth scale)  0  0   MSRs:                                           Right        Left brachioradialis 2+  2+  biceps 2+  2+  triceps 2+  2+  patellar 3+  3+  ankle jerk 2+  2+  Hoffman no  no  plantar response down  down   SENSORY:  Normal and symmetric perception of light touch, pinprick, vibration, and  temperature.     COORDINATION/GAIT: Normal finger-to- nose-finger.  Intact rapid alternating movements bilaterally.  Gait narrow based and stable. Tandem and stressed gait intact.    Thank  you for allowing me to participate in patient's care.  If I can answer any additional questions, I would be pleased to do so.    Sincerely,    Traylen Eckels K. Allena Katz, DO

## 2024-03-09 ENCOUNTER — Encounter: Payer: Self-pay | Admitting: Neurology

## 2024-03-11 ENCOUNTER — Ambulatory Visit: Admission: RE | Admit: 2024-03-11 | Source: Ambulatory Visit

## 2024-03-13 DIAGNOSIS — M25552 Pain in left hip: Secondary | ICD-10-CM | POA: Diagnosis not present

## 2024-03-13 DIAGNOSIS — M9903 Segmental and somatic dysfunction of lumbar region: Secondary | ICD-10-CM | POA: Diagnosis not present

## 2024-03-13 DIAGNOSIS — M25551 Pain in right hip: Secondary | ICD-10-CM | POA: Diagnosis not present

## 2024-03-13 DIAGNOSIS — M4727 Other spondylosis with radiculopathy, lumbosacral region: Secondary | ICD-10-CM | POA: Diagnosis not present

## 2024-06-04 ENCOUNTER — Other Ambulatory Visit: Payer: Self-pay

## 2024-06-04 ENCOUNTER — Ambulatory Visit
Admission: RE | Admit: 2024-06-04 | Discharge: 2024-06-04 | Disposition: A | Discharge status: Home / Self Care | Attending: Physician Assistant | Admitting: Physician Assistant

## 2024-06-04 ENCOUNTER — Ambulatory Visit (INDEPENDENT_AMBULATORY_CARE_PROVIDER_SITE_OTHER): Admitting: Radiology

## 2024-06-04 VITALS — BP 149/91 | HR 76 | Temp 98.3°F | Resp 19 | Ht 63.0 in | Wt 176.0 lb

## 2024-06-04 DIAGNOSIS — M79675 Pain in left toe(s): Secondary | ICD-10-CM

## 2024-06-04 DIAGNOSIS — M109 Gout, unspecified: Secondary | ICD-10-CM

## 2024-06-04 MED ORDER — COLCHICINE 0.6 MG PO TABS: 0.6000 mg | ORAL_TABLET | Freq: Every day | ORAL | 0 refills | Status: AC

## 2024-06-04 NOTE — ED Provider Notes (Signed)
 [facility]    CSN: [id number] Arrival date & time: 06/04/24  1051      History   Chief Complaint Chief Complaint  Patient presents with   Foot Pain    Entered by patient    HPI Lynn Crawford is a 58 y.o. female.   HPI  Pt presents today for concerns of left great toe and left foot pain  She states this has been ongoing since 05/27/24 and did not improve when she took a  steroid taper for gout flare. She reports the left second toe is starting to feel numb She is concerned for potential fracture in the foot/toe- she states she was trying to ride a bicycle the day the pain started and was stomping her feet a lot but cannot recall a discrete injury to the area  Pt reports she has a previous hx of gout and her current symptoms feel similar to this but did not improve at all with Prednisone  taper.    Past Medical History:  Diagnosis Date   Elevated cholesterol    Gout    HSV infection    vaginal   Hypertension     Patient Active Problem List   Diagnosis Date Noted   Well woman exam with routine gynecological exam 02/08/2024   Uses hormone releasing intrauterine device (IUD) for contraception 02/08/2024   Primary insomnia 02/08/2024   Postmenopausal hormone replacement therapy 02/08/2024   Gout    Elevated cholesterol    Hypertension 04/11/2020    Past Surgical History:  Procedure Laterality Date   CERVICAL BIOPSY  W/ LOOP ELECTRODE EXCISION     x2   INTRAUTERINE DEVICE INSERTION     mirena  iud inserted 04-25-2019    OB History     Gravida  3   Para  1   Term  1   Preterm      AB  2   Living  1      SAB      IAB  2   Ectopic      Multiple      Live Births               Home Medications    Prior to Admission medications   Medication Sig Start Date End Date Taking? Authorizing Provider  colchicine  0.6 MG tablet Take 1 tablet (0.6 mg total) by mouth daily. 06/04/24  Yes Eduardo Wurth E, PA-C  predniSONE  (STERAPRED UNI-PAK  21 TAB) 10 MG (21) TBPK tablet Take by mouth daily. Take 6 tabs by mouth daily  for 1 days, then 5 tabs for 1 days, then 4 tabs for 1 days, then 3 tabs for 1 days, 2 tabs for 1 days, then 1 tab by mouth daily for 1 days 03/06/23  Yes Avegno, Komlanvi S, FNP  allopurinol (ZYLOPRIM) 100 MG tablet Take 100 mg by mouth daily. 01/20/24   [provider]  amLODipine (NORVASC) 10 MG tablet Take 5 mg by mouth daily. 01/22/22   [provider]  atorvastatin (LIPITOR) 10 MG tablet Take 10 mg by mouth daily. 01/23/22   [provider]  colchicine  0.6 MG tablet 1 tablet Orally as needed for gout flare ups    [provider]  cyclobenzaprine  (FLEXERIL ) 5 MG tablet Take 1 tablet (5 mg total) by mouth at bedtime as needed for muscle spasms. 02/29/24   Patel, Donika K, DO  estradiol  (VIVELLE -DOT) 0.075 MG/24HR Place 1 patch onto the skin 2 (two) times a  week. 02/10/24   Dallie Vera GAILS, MD  ibuprofen (ADVIL) 600 MG tablet Take 600 mg by mouth every 6 (six) hours as needed. 12/15/21   [provider]  levonorgestrel  (MIRENA ) 20 MCG/DAY IUD 1 each by Intrauterine route once. 04/24/19   [provider]  metoprolol succinate (TOPROL-XL) 25 MG 24 hr tablet TK 1 T PO QD 10/03/19   [provider]  pantoprazole (PROTONIX) 40 MG tablet Take 40 mg by mouth daily.    [provider]    Family History Family History  Problem Relation Age of Onset   Diabetes Mother    Hypertension Mother    Hypertension Father    Hypertension Sister    Cancer Brother        colon   Hypertension Brother    Breast cancer Other     Social History Social History   Tobacco Use   Smoking status: Never   Smokeless tobacco: Never  Vaping Use   Vaping status: Never Used  Substance Use Topics   Alcohol use: Yes    Comment: glass of wine with dinner   Drug use: Never     Allergies   Amlodipine besylate   Review of Systems Review of Systems  Musculoskeletal:         Left foot and great toe pain       Physical Exam Triage Vital Signs ED Triage Vitals  Encounter Vitals Group     BP 06/04/24 1117 (!) 149/91     Girls Systolic BP Percentile --      Girls Diastolic BP Percentile --      Boys Systolic BP Percentile --      Boys Diastolic BP Percentile --      Pulse Rate 06/04/24 1117 76     Resp 06/04/24 1117 19     Temp 06/04/24 1117 98.3 F (36.8 C)     Temp Source 06/04/24 1117 Oral     SpO2 06/04/24 1117 95 %     Weight 06/04/24 1115 176 lb (79.8 kg)     Height 06/04/24 1115 5' 3 (1.6 m)     Head Circumference --      Peak Flow --      Pain Score 06/04/24 1115 4     Pain Loc --      Pain Education --      Exclude from Growth Chart --    No data found.  Updated Vital Signs BP (!) 149/91 (BP Location: Right Arm)   Pulse 76   Temp 98.3 F (36.8 C) (Oral)   Resp 19   Ht 5' 3 (1.6 m)   Wt 176 lb (79.8 kg)   SpO2 95%   BMI 31.18 kg/m   Visual Acuity Right Eye Distance:   Left Eye Distance:   Bilateral Distance:    Right Eye Near:   Left Eye Near:    Bilateral Near:     Physical Exam Vitals reviewed.  Constitutional:      General: She is awake.     Appearance: Normal appearance. She is well-developed and well-groomed.  HENT:     Head: Normocephalic and atraumatic.  Eyes:     General: Lids are normal. Gaze aligned appropriately.     Extraocular Movements: Extraocular movements intact.     Conjunctiva/sclera: Conjunctivae normal.  Cardiovascular:     Pulses:          Dorsalis pedis pulses are 2+ on the left side.  Posterior tibial pulses are 2+ on the left side.  Pulmonary:     Effort: Pulmonary effort is normal.  Musculoskeletal:     Left foot: Decreased range of motion. No deformity.       Feet:  Feet:     Left foot:     Skin integrity: Erythema present. No ulcer, blister, skin breakdown, warmth, callus, dry skin or fissure.     Toenail Condition: Left toenails are normal.     Comments: Decreased flexion  and extension of left great toe  Neurological:     Mental Status: She is alert and oriented to person, place, and time.  Psychiatric:        Attention and Perception: Attention and perception normal.        Mood and Affect: Mood and affect normal.        Speech: Speech normal.        Behavior: Behavior normal. Behavior is cooperative.      UC Treatments / Results  Labs (all labs ordered are listed, but only abnormal results are displayed) Labs Reviewed - No data to display  EKG   Radiology DG Toe Great Left Result Date: 06/04/2024 CLINICAL DATA:  Left great toe pain for 1 week after possible injury. EXAM: LEFT GREAT TOE COMPARISON:  January 20, 2024 FINDINGS: There is no evidence of fracture or dislocation. There is no evidence of arthropathy or other focal bone abnormality. Soft tissues are unremarkable. IMPRESSION: Negative. Electronically Signed   By: Lynwood Landy Raddle M.D.   On: 06/04/2024 12:09    Procedures Procedures (including critical care time)  Medications Ordered in UC Medications - No data to display  Initial Impression / Assessment and Plan / UC Course  I have reviewed the triage vital signs and the nursing notes.  Pertinent labs & imaging results that were available during my care of the patient were reviewed by me and considered in my medical decision making (see chart for details).     Patient presents today with concerns of left great toe pain that has been ongoing since 05/27/2024 and has not improved despite use of a steroid taper for suspected gout.  Patient reports that her symptoms feel very similar to previous gout flares but she is unsure why this did not respond to prednisone  taper.  She is concerned for potential fracture as she was very active the weekend that the pain started.  Physical exam is notable for mild erythema and swelling along the base of the left great toe as well as decreased flexion extension compared to the right foot.  Imaging was  negative for signs of acute fracture or dislocation.  Based on symptoms and presentation I am suspicious for acute gout flare.  Will send patient home with colchicine  0.6 mg.  Recommend taking 1.2 mg today and taking 0.6 mg daily following this for further relief.  Will provide patient with low purine dietary recommendations to assist with further reduction.  Recommend follow-up with PCP if symptoms are not improving or seem to be worsening  Final Clinical Impressions(s) / UC Diagnoses   Final diagnoses:  Pain of left great toe  Acute gout involving toe of left foot, unspecified cause     Discharge Instructions      You were seen today for concerns of left great toe pain Your xrays were negative for signs of a fracture or dislocation  I suspect you may be having a gout flare and I am sending in a script  for Colchicine . You can take two tablets today and then take one tablet per day after this for management If your symptoms are not improving or worsening please follow up with your PCP for further management      ED Prescriptions     Medication Sig Dispense Auth. Provider   colchicine  0.6 MG tablet Take 1 tablet (0.6 mg total) by mouth daily. 15 tablet Letonya Mangels E, PA-C      PDMP not reviewed this encounter.   Marylene Rocky BRAVO, PA-C 06/04/24 1655

## 2024-06-04 NOTE — Discharge Instructions (Addendum)
 You were seen today for concerns of left great toe pain Your xrays were negative for signs of a fracture or dislocation  I suspect you may be having a gout flare and I am sending in a script for Colchicine . You can take two tablets today and then take one tablet per day after this for management If your symptoms are not improving or worsening please follow up with your PCP for further management
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
## 2024-06-21 DIAGNOSIS — M7918 Myalgia, other site: Secondary | ICD-10-CM | POA: Diagnosis not present

## 2024-06-21 DIAGNOSIS — M79672 Pain in left foot: Secondary | ICD-10-CM | POA: Diagnosis not present

## 2024-06-21 DIAGNOSIS — M79671 Pain in right foot: Secondary | ICD-10-CM | POA: Diagnosis not present

## 2024-06-21 DIAGNOSIS — M109 Gout, unspecified: Secondary | ICD-10-CM | POA: Diagnosis not present

## 2024-07-10 DIAGNOSIS — R768 Other specified abnormal immunological findings in serum: Secondary | ICD-10-CM | POA: Diagnosis not present

## 2024-07-10 DIAGNOSIS — R7401 Elevation of levels of liver transaminase levels: Secondary | ICD-10-CM | POA: Diagnosis not present

## 2024-07-10 DIAGNOSIS — M79672 Pain in left foot: Secondary | ICD-10-CM | POA: Diagnosis not present

## 2024-07-10 DIAGNOSIS — M109 Gout, unspecified: Secondary | ICD-10-CM | POA: Diagnosis not present

## 2024-07-31 DIAGNOSIS — R7401 Elevation of levels of liver transaminase levels: Secondary | ICD-10-CM | POA: Diagnosis not present

## 2024-07-31 DIAGNOSIS — M79672 Pain in left foot: Secondary | ICD-10-CM | POA: Diagnosis not present

## 2024-07-31 DIAGNOSIS — R768 Other specified abnormal immunological findings in serum: Secondary | ICD-10-CM | POA: Diagnosis not present

## 2024-07-31 DIAGNOSIS — M109 Gout, unspecified: Secondary | ICD-10-CM | POA: Diagnosis not present

## 2024-12-28 ENCOUNTER — Other Ambulatory Visit: Payer: Self-pay | Admitting: Obstetrics and Gynecology

## 2024-12-28 DIAGNOSIS — Z1231 Encounter for screening mammogram for malignant neoplasm of breast: Secondary | ICD-10-CM

## 2025-01-03 ENCOUNTER — Ambulatory Visit

## 2025-02-08 ENCOUNTER — Ambulatory Visit: Admitting: Obstetrics and Gynecology
